# Patient Record
Sex: Male | Born: 1945 | Race: Black or African American | Hispanic: No | Smoking: Current every day smoker
Health system: Southern US, Community
[De-identification: ages and names within clinical notes are randomized; demographics above are authoritative.]

## PROBLEM LIST (undated history)

## (undated) DIAGNOSIS — C801 Malignant (primary) neoplasm, unspecified: Secondary | ICD-10-CM

## (undated) DIAGNOSIS — I10 Essential (primary) hypertension: Secondary | ICD-10-CM

## (undated) DIAGNOSIS — IMO0002 Reserved for concepts with insufficient information to code with codable children: Secondary | ICD-10-CM

## (undated) DIAGNOSIS — F329 Major depressive disorder, single episode, unspecified: Secondary | ICD-10-CM

## (undated) DIAGNOSIS — IMO0001 Reserved for inherently not codable concepts without codable children: Secondary | ICD-10-CM

## (undated) DIAGNOSIS — E119 Type 2 diabetes mellitus without complications: Secondary | ICD-10-CM

## (undated) DIAGNOSIS — T7840XA Allergy, unspecified, initial encounter: Secondary | ICD-10-CM

## (undated) DIAGNOSIS — K219 Gastro-esophageal reflux disease without esophagitis: Secondary | ICD-10-CM

## (undated) HISTORY — DX: Reserved for inherently not codable concepts without codable children: IMO0001

## (undated) HISTORY — PX: OTHER SURGICAL HISTORY: SHX169

## (undated) HISTORY — DX: Essential (primary) hypertension: I10

## (undated) HISTORY — DX: Reserved for concepts with insufficient information to code with codable children: IMO0002

## (undated) HISTORY — DX: Type 2 diabetes mellitus without complications: E11.9

## (undated) HISTORY — DX: Major depressive disorder, single episode, unspecified: F32.9

## (undated) HISTORY — DX: Gastro-esophageal reflux disease without esophagitis: K21.9

---

## 2006-09-11 ENCOUNTER — Ambulatory Visit: Payer: Self-pay | Admitting: Family Medicine

## 2006-09-14 ENCOUNTER — Ambulatory Visit: Payer: Self-pay | Admitting: *Deleted

## 2006-12-10 ENCOUNTER — Ambulatory Visit: Payer: Self-pay | Admitting: Family Medicine

## 2006-12-12 ENCOUNTER — Ambulatory Visit (HOSPITAL_COMMUNITY): Admission: RE | Admit: 2006-12-12 | Discharge: 2006-12-12 | Payer: Self-pay | Admitting: Family Medicine

## 2007-08-07 ENCOUNTER — Encounter (INDEPENDENT_AMBULATORY_CARE_PROVIDER_SITE_OTHER): Payer: Self-pay | Admitting: *Deleted

## 2009-05-28 ENCOUNTER — Emergency Department (HOSPITAL_COMMUNITY): Admission: EM | Admit: 2009-05-28 | Discharge: 2009-05-28 | Payer: Self-pay | Admitting: Family Medicine

## 2009-09-08 ENCOUNTER — Emergency Department (HOSPITAL_COMMUNITY): Admission: EM | Admit: 2009-09-08 | Discharge: 2009-09-08 | Payer: Self-pay | Admitting: Family Medicine

## 2009-10-05 ENCOUNTER — Ambulatory Visit: Payer: Self-pay | Admitting: Internal Medicine

## 2009-10-05 DIAGNOSIS — K219 Gastro-esophageal reflux disease without esophagitis: Secondary | ICD-10-CM

## 2009-10-05 DIAGNOSIS — I10 Essential (primary) hypertension: Secondary | ICD-10-CM | POA: Insufficient documentation

## 2009-10-05 DIAGNOSIS — E119 Type 2 diabetes mellitus without complications: Secondary | ICD-10-CM

## 2009-10-05 DIAGNOSIS — F4323 Adjustment disorder with mixed anxiety and depressed mood: Secondary | ICD-10-CM

## 2009-10-05 DIAGNOSIS — F329 Major depressive disorder, single episode, unspecified: Secondary | ICD-10-CM

## 2009-10-05 DIAGNOSIS — F3289 Other specified depressive episodes: Secondary | ICD-10-CM

## 2009-10-05 HISTORY — DX: Gastro-esophageal reflux disease without esophagitis: K21.9

## 2009-10-05 HISTORY — DX: Essential (primary) hypertension: I10

## 2009-10-05 HISTORY — DX: Other specified depressive episodes: F32.89

## 2009-10-05 HISTORY — DX: Type 2 diabetes mellitus without complications: E11.9

## 2009-10-05 HISTORY — DX: Major depressive disorder, single episode, unspecified: F32.9

## 2009-10-12 LAB — CONVERTED CEMR LAB
ALT: 9 units/L (ref 0–53)
AST: 16 units/L (ref 0–37)
Alkaline Phosphatase: 57 units/L (ref 39–117)
BUN: 7 mg/dL (ref 6–23)
Basophils Absolute: 0.1 10*3/uL (ref 0.0–0.1)
Basophils Relative: 1 % (ref 0.0–3.0)
Bilirubin Urine: NEGATIVE
Cholesterol: 206 mg/dL — ABNORMAL HIGH (ref 0–200)
Creatinine, Ser: 1 mg/dL (ref 0.4–1.5)
Creatinine,U: 110.4 mg/dL
Direct LDL: 159 mg/dL
Eosinophils Relative: 4.9 % (ref 0.0–5.0)
HDL: 41.3 mg/dL (ref >39.00)
Hemoglobin: 14 g/dL (ref 13.0–17.0)
Ketones, ur: NEGATIVE mg/dL
Lymphocytes Relative: 42.1 % (ref 12.0–46.0)
Lymphs Abs: 3.6 10*3/uL (ref 0.7–4.0)
MCHC: 33.4 g/dL (ref 30.0–36.0)
MCV: 94.1 fL (ref 78.0–100.0)
PSA: 2.29 ng/mL (ref 0.10–4.00)
Potassium: 4.5 meq/L (ref 3.5–5.1)
RBC: 4.45 M/uL (ref 4.22–5.81)
RDW: 12.8 % (ref 11.5–14.6)
Sodium: 139 meq/L (ref 135–145)
TSH: 1.15 microintl units/mL (ref 0.35–5.50)
Total Protein: 7.4 g/dL (ref 6.0–8.3)
Triglycerides: 120 mg/dL (ref 0.0–149.0)
Urine Glucose: NEGATIVE mg/dL
VLDL: 24 mg/dL (ref 0.0–40.0)

## 2009-12-30 ENCOUNTER — Encounter: Payer: Self-pay | Admitting: Internal Medicine

## 2010-01-31 ENCOUNTER — Encounter: Payer: Self-pay | Admitting: Internal Medicine

## 2010-12-20 NOTE — Letter (Signed)
Summary: Referral - not able to see patient  [facility]  [address]   [place], [place] [address]   Phone: [phone]  Fax: [phone]    January 31, 2010    Retaj Hilbun W. Jaxie Racanelli, M.D. [address] [place], [place] [address]   Re:   Luis Brewer DOB:  07/24/1946 MRN:   [id number]    Dear Dr. Mareta Chesnut:  Thank you for your kind referral of the above patient.  We have attempted to schedule the recommended procedure Screening Colonoscopy but have not been able to schedule because:   X  The patient was not available by phone and/or has not returned our calls.  ___ The patient declined to schedule the procedure at this time.  We appreciate the referral and hope that we will have the opportunity to treat this patient in the future.    Sincerely,    [facility] [phone]    

## 2010-12-20 NOTE — Letter (Signed)
Summary: CMN for Diabetes Testing Supplies/[facility]  CMN for Diabetes Testing Supplies/[facility]   Imported By: Sherian Rein 01/04/2010 08:21:27  _____________________________________________________________________  External Attachment:    Type:   Image     Comment:   External Document

## 2010-12-20 NOTE — Letter (Signed)
Summary: Referral - not able to see patient  [facility]  [address] [place], [place] [address]   Phone: [phone]  Fax: [phone]    January 31, 2010    Corwin Levins, M.D. [address] [place], [place] [address]   Luis Brewer DOB:  07-09-1946 MRN:   [id number]    Dear Dr. Jonny Ruiz:  Thank you for your kind referral of the above patient.  We have attempted to schedule the recommended procedure Screening Colonoscopy but have not been able to schedule because:   X  The patient was not available by phone and/or has not returned our calls.  ___ The patient declined to schedule the procedure at this time.  We appreciate the referral and hope that we will have the opportunity to treat this patient in the future.    Sincerely,    [facility] [phone]

## 2011-06-13 ENCOUNTER — Encounter: Payer: Self-pay | Admitting: Internal Medicine

## 2011-06-13 DIAGNOSIS — Z0000 Encounter for general adult medical examination without abnormal findings: Secondary | ICD-10-CM | POA: Insufficient documentation

## 2011-06-15 ENCOUNTER — Other Ambulatory Visit (INDEPENDENT_AMBULATORY_CARE_PROVIDER_SITE_OTHER): Payer: Medicare Other

## 2011-06-15 ENCOUNTER — Encounter: Payer: Self-pay | Admitting: Internal Medicine

## 2011-06-15 ENCOUNTER — Ambulatory Visit (INDEPENDENT_AMBULATORY_CARE_PROVIDER_SITE_OTHER): Payer: Medicare Other | Admitting: Internal Medicine

## 2011-06-15 VITALS — BP 140/90 | HR 87 | Temp 98.1°F | Ht 71.0 in | Wt 157.2 lb

## 2011-06-15 DIAGNOSIS — R5383 Other fatigue: Secondary | ICD-10-CM | POA: Insufficient documentation

## 2011-06-15 DIAGNOSIS — F329 Major depressive disorder, single episode, unspecified: Secondary | ICD-10-CM

## 2011-06-15 DIAGNOSIS — E119 Type 2 diabetes mellitus without complications: Secondary | ICD-10-CM

## 2011-06-15 DIAGNOSIS — R5381 Other malaise: Secondary | ICD-10-CM

## 2011-06-15 DIAGNOSIS — N320 Bladder-neck obstruction: Secondary | ICD-10-CM

## 2011-06-15 DIAGNOSIS — F3289 Other specified depressive episodes: Secondary | ICD-10-CM

## 2011-06-15 DIAGNOSIS — F1010 Alcohol abuse, uncomplicated: Secondary | ICD-10-CM | POA: Insufficient documentation

## 2011-06-15 DIAGNOSIS — I10 Essential (primary) hypertension: Secondary | ICD-10-CM

## 2011-06-15 LAB — CBC WITH DIFFERENTIAL/PLATELET
Basophils Relative: 0.8 % (ref 0.0–3.0)
Eosinophils Relative: 2.3 % (ref 0.0–5.0)
HCT: 38.3 % — ABNORMAL LOW (ref 39.0–52.0)
Hemoglobin: 13.3 g/dL (ref 13.0–17.0)
Lymphocytes Relative: 34.9 % (ref 12.0–46.0)
MCHC: 34.8 g/dL (ref 30.0–36.0)
Monocytes Relative: 8.8 % (ref 3.0–12.0)
Neutrophils Relative %: 53.2 % (ref 43.0–77.0)
RBC: 3.9 Mil/uL — ABNORMAL LOW (ref 4.22–5.81)
RDW: 13.4 % (ref 11.5–14.6)

## 2011-06-15 LAB — URINALYSIS, ROUTINE W REFLEX MICROSCOPIC
Hgb urine dipstick: NEGATIVE
Leukocytes, UA: NEGATIVE
Nitrite: NEGATIVE
Total Protein, Urine: NEGATIVE
Urine Glucose: NEGATIVE
pH: 7 (ref 5.0–8.0)

## 2011-06-15 MED ORDER — DISULFIRAM 250 MG PO TABS: 250.0000 mg | ORAL_TABLET | Freq: Every day | ORAL | Status: AC

## 2011-06-15 MED ORDER — METFORMIN HCL 500 MG PO TABS: 500.0000 mg | ORAL_TABLET | Freq: Two times a day (BID) | ORAL | Status: DC

## 2011-06-15 MED ORDER — CITALOPRAM HYDROBROMIDE 40 MG PO TABS: 40.0000 mg | ORAL_TABLET | Freq: Every day | ORAL | Status: DC

## 2011-06-15 MED ORDER — CLONIDINE HCL 0.1 MG PO TABS: 0.1000 mg | ORAL_TABLET | Freq: Two times a day (BID) | ORAL | Status: DC

## 2011-06-15 MED ORDER — SIMVASTATIN 40 MG PO TABS: 40.0000 mg | ORAL_TABLET | Freq: Every day | ORAL | Status: DC

## 2011-06-15 MED ORDER — OMEPRAZOLE 40 MG PO CPDR: 40.0000 mg | DELAYED_RELEASE_CAPSULE | Freq: Every day | ORAL | Status: DC
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
## 2011-06-15 NOTE — Assessment & Plan Note (Signed)
stable overall by hx and exam, most recent data reviewed with pt, and pt to continue medical treatment as before  BP Readings from Last 3 Encounters:  06/15/11 140/90  10/05/09 112/70

## 2011-06-15 NOTE — Assessment & Plan Note (Signed)
stable overall by hx and exam, most recent data reviewed with pt, and pt to continue medical treatment as before  Lab Results  Component Value Date   HGBA1C 7.1* 10/05/2009

## 2011-06-15 NOTE — Assessment & Plan Note (Signed)
stable overall by hx and exam, most recent data reviewed with pt, and pt to continue medical treatment as before  Lab Results  Component Value Date   WBC 8.5 10/05/2009   HGB 14.0 10/05/2009   HCT 41.9 10/05/2009   PLT 233.0 10/05/2009   CHOL 206* 10/05/2009   TRIG 120.0 10/05/2009   HDL 41.30 10/05/2009   LDLDIRECT 159.0 10/05/2009   ALT 9 10/05/2009   AST 16 10/05/2009   NA 139 10/05/2009   K 4.5 10/05/2009   CL 101 10/05/2009   CREATININE 1.0 10/05/2009   BUN 7 10/05/2009   CO2 30 10/05/2009   TSH 1.15 10/05/2009   PSA 2.29 10/05/2009   HGBA1C 7.1* 10/05/2009   MICROALBUR 0.2 10/05/2009

## 2011-06-15 NOTE — Assessment & Plan Note (Signed)
Also due for PSA 

## 2011-06-15 NOTE — Assessment & Plan Note (Signed)
Etiology unclear, Exam otherwise benign, to check labs as documented, follow with expectant management  

## 2011-06-15 NOTE — Assessment & Plan Note (Signed)
Ok for daily antabuse per pt request, also encouraged to seek AA

## 2011-06-15 NOTE — Patient Instructions (Signed)
Take all new medications as prescribed  - the antabuse Continue all other medications as before Please go to LAB in the Basement for the blood and/or urine tests to be done today Please call the phone number [phone] (the PhoneTree System) for results of testing in 2-3 days;  When calling, simply dial the number, and when prompted enter the MRN number above (the Medical Record Number) and the # key, then the message should start. Please return in 6 months, or sooner if needed

## 2011-06-16 ENCOUNTER — Other Ambulatory Visit: Payer: Self-pay | Admitting: Internal Medicine

## 2011-06-16 ENCOUNTER — Ambulatory Visit: Payer: Medicare Other

## 2011-06-16 DIAGNOSIS — R972 Elevated prostate specific antigen [PSA]: Secondary | ICD-10-CM | POA: Insufficient documentation

## 2011-06-16 DIAGNOSIS — R7402 Elevation of levels of lactic acid dehydrogenase (LDH): Secondary | ICD-10-CM

## 2011-06-16 LAB — BASIC METABOLIC PANEL
BUN: 5 mg/dL — ABNORMAL LOW (ref 6–23)
Calcium: 9.7 mg/dL (ref 8.4–10.5)
Chloride: 91 mEq/L — ABNORMAL LOW (ref 96–112)

## 2011-06-16 LAB — HEPATIC FUNCTION PANEL
ALT: 82 U/L — ABNORMAL HIGH (ref 0–53)
Albumin: 4.1 g/dL (ref 3.5–5.2)
Bilirubin, Direct: 0.2 mg/dL (ref 0.0–0.3)
Total Bilirubin: 0.7 mg/dL (ref 0.3–1.2)
Total Protein: 8.3 g/dL (ref 6.0–8.3)

## 2011-06-16 LAB — TSH: TSH: 1.34 u[IU]/mL (ref 0.35–5.50)

## 2011-06-16 LAB — LIPID PANEL
HDL: 66.4 mg/dL (ref >39.00)
Triglycerides: 48 mg/dL (ref 0.0–149.0)
VLDL: 9.6 mg/dL (ref 0.0–40.0)

## 2011-06-18 LAB — HEPATITIS PANEL, ACUTE
HCV Ab: NEGATIVE
Hep A IgM: NEGATIVE
Hep B C IgM: NEGATIVE

## 2011-06-19 NOTE — Progress Notes (Signed)
Quick Note:  Voice message left on PhoneTree system - lab is negative, normal or otherwise stable, pt to continue same tx ______ 

## 2011-06-28 ENCOUNTER — Encounter: Payer: Self-pay | Admitting: Internal Medicine

## 2012-08-30 DIAGNOSIS — C801 Malignant (primary) neoplasm, unspecified: Secondary | ICD-10-CM

## 2012-08-30 HISTORY — PX: OTHER SURGICAL HISTORY: SHX169

## 2012-08-30 HISTORY — DX: Malignant (primary) neoplasm, unspecified: C80.1

## 2012-09-09 ENCOUNTER — Encounter: Payer: Self-pay | Admitting: *Deleted

## 2012-09-09 ENCOUNTER — Telehealth: Payer: Self-pay | Admitting: Oncology

## 2012-09-09 NOTE — Telephone Encounter (Signed)
C/D 09/09/12 for appt 09/16/12

## 2012-09-10 ENCOUNTER — Ambulatory Visit
Admission: RE | Admit: 2012-09-10 | Discharge: 2012-09-10 | Disposition: A | Discharge status: Home / Self Care | Payer: Medicare Other | Source: Ambulatory Visit | Attending: Radiation Oncology | Admitting: Radiation Oncology

## 2012-09-10 ENCOUNTER — Encounter: Payer: Self-pay | Admitting: Radiation Oncology

## 2012-09-10 VITALS — BP 135/87 | HR 94 | Temp 97.8°F | Resp 20 | Wt 144.6 lb

## 2012-09-10 DIAGNOSIS — Z79899 Other long term (current) drug therapy: Secondary | ICD-10-CM | POA: Insufficient documentation

## 2012-09-10 DIAGNOSIS — C499 Malignant neoplasm of connective and soft tissue, unspecified: Secondary | ICD-10-CM

## 2012-09-10 DIAGNOSIS — I10 Essential (primary) hypertension: Secondary | ICD-10-CM | POA: Insufficient documentation

## 2012-09-10 DIAGNOSIS — C4920 Malignant neoplasm of connective and soft tissue of unspecified lower limb, including hip: Secondary | ICD-10-CM

## 2012-09-10 DIAGNOSIS — K219 Gastro-esophageal reflux disease without esophagitis: Secondary | ICD-10-CM | POA: Insufficient documentation

## 2012-09-10 DIAGNOSIS — R634 Abnormal weight loss: Secondary | ICD-10-CM | POA: Insufficient documentation

## 2012-09-10 DIAGNOSIS — C801 Malignant (primary) neoplasm, unspecified: Secondary | ICD-10-CM

## 2012-09-10 DIAGNOSIS — Z510 Encounter for antineoplastic radiation therapy: Secondary | ICD-10-CM | POA: Insufficient documentation

## 2012-09-10 DIAGNOSIS — E119 Type 2 diabetes mellitus without complications: Secondary | ICD-10-CM | POA: Insufficient documentation

## 2012-09-10 HISTORY — DX: Malignant (primary) neoplasm, unspecified: C80.1

## 2012-09-10 MED ORDER — HYDROCODONE-ACETAMINOPHEN 5-500 MG PO CAPS: 1.0000 | ORAL_CAPSULE | Freq: Four times a day (QID) | ORAL | Status: DC | PRN

## 2012-09-10 NOTE — Addendum Note (Signed)
Encounter addended by: [facility] Mintz Jeanae Whitmill, RN on: 09/10/2012 11:34 AM<BR>     Documentation filed: Charges VN
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
## 2012-09-10 NOTE — Progress Notes (Signed)
Path: 10/10 13: Left Thigh: high grade sarcoma with areas of necrosis and features most consistent with pleomorphic liposarcoma Patient here alert,oriented x3,divorced, 4 children,just moved here 3 days ago living with daughter, knot /mass left thigh/groin area, pain  States"9/10 scale, took ultram 50mg  this am little relief    Allergies: NKda

## 2012-09-16 ENCOUNTER — Ambulatory Visit: Payer: Medicare Other | Admitting: Oncology

## 2012-09-16 ENCOUNTER — Ambulatory Visit: Payer: Medicare Other

## 2012-09-16 ENCOUNTER — Other Ambulatory Visit: Payer: Medicare Other

## 2012-11-19 NOTE — Progress Notes (Signed)
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

## 2012-11-21 ENCOUNTER — Ambulatory Visit
Admission: RE | Admit: 2012-11-21 | Discharge: 2012-11-21 | Disposition: A | Discharge status: Home / Self Care | Payer: Medicare Other | Source: Ambulatory Visit | Attending: Radiation Oncology | Admitting: Radiation Oncology

## 2012-11-21 ENCOUNTER — Encounter: Payer: Self-pay | Admitting: Radiation Oncology

## 2012-11-21 VITALS — BP 148/79 | HR 95 | Temp 97.4°F | Resp 20 | Ht 69.0 in | Wt 163.9 lb

## 2012-11-21 DIAGNOSIS — C4920 Malignant neoplasm of connective and soft tissue of unspecified lower limb, including hip: Secondary | ICD-10-CM

## 2012-11-21 HISTORY — DX: Allergy, unspecified, initial encounter: T78.40XA

## 2012-11-21 NOTE — Progress Notes (Signed)
CC: Dr. Josefine Class fax number [phone], Dr. Cline Cools fax number [phone] , Dr. 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 I first saw the patient in consultation on 09/10/2012 at which time he presented with a left thigh mass with referral from Dr. Jennelle Human in [facility]. A noncontrast MRI scan from 08/07/2012 showed a 9.2 x 9.1 x 6.7 cm mass arising from the abductor magnets muscle. I referred him to Dr. Josefine Class at [facility] where he was fully staged. I do not have his outside information with respect to his pathology, and operative report. His preoperative CT scan to the chest, abdomen, and pelvis showed a 13 x 10 cm left thigh mass displaced the femoral arteries and veins with no evidence for metastatic disease. There was a nonspecific 0.6 cm left lower lobe ground glass pulmonary nodule for which follow up was recommended. Understand he was taken to the OR on November 11 hours time he had a radical resection of a 12.5 x 9.0 cm high-grade pleomorphic sarcoma arising from the left abductor magnets muscle. Pathology revealed negative margins, but disease was seen 0.1 cm from the lateral margin. He had local advancement flaps for closure. He had significant intraoperative bleeding and a lengthy operation. He was seen postoperatively by medical oncology and radiation oncology. He is referred today by Dr. Serita Sheller for postoperative radiation therapy closer to home where he now lives with his sister. He is without complaints today. He does walk with a cane.  Physical examination: Alert and oriented 67 year old African- American male appearing his stated  age. Wt Readings from Last 3 Encounters:  11/21/12 163 lb 14.4 oz (74.345 kg)  09/10/12 144 lb 9.6 oz (65.59 kg)  06/15/11 157 lb 4 oz (71.328 kg)   Temp Readings from Last 3 Encounters:  11/21/12 97.4 F (36.3 C) Oral  09/10/12 97.8 F (36.6 C)   06/15/11 98.1 F (36.7 C) Oral   BP Readings from Last 3 Encounters:  11/21/12 148/79  09/10/12 135/87  06/15/11 140/90   Pulse Readings from Last 3 Encounters:  11/21/12 95  09/10/12 94  06/15/11 87   Head neck examination: Grossly unremarkable. Nodes: Without palpable cervical, supraclavicular, or inguinal/femoral/popliteal adenopathy. Chest: Lungs clear. Heart: Regular in rhythm. Abdomen: Without masses organomegaly. Genitalia: Unremarkable to inspection. Extremities: There is a vertical scar extending from his left inguinal fold inferiorly to the left mid thigh anteriorly and anteromedially. There is obvious surgical defect but no visible or palpable evidence for recurrent disease. There is no left lower extremity edema. Neurologic examination: There is weakness of left hip abduction and left hip flexion.  Impression: Stage III (T2 B. N0 M0) high-grade pleomorphic sarcoma arising from the left abductor magnus muscle. I need to obtain his preoperative MRI scan, pathology, and operative report from [facility]. He would benefit from postoperative radiation therapy to improve his local regional control. The patient's daughter tells me that chemotherapy would be optional following radiation therapy. There are going to be transportation issues, and I'm having the patient and his daughter speak with social services to see if transportation can be arranged from his residence here in [place]. We discussed the potential acute and late toxicities of radiation  therapy. He'll return for treatment planning hopefully within the next one to 2 weeks. Consent is signed today.  30 minutes was spent face-to-face with the patient and his daughter,  primarily counseling the patient.

## 2012-11-21 NOTE — Progress Notes (Signed)
Patient here follow up  New consult left thigh high grade sarcoma PT2bcNO, seen by Dr. Nichola Sizer 11/05/12 09/30/12 radical resection of left  large deep soft tissue tumor,dissection of the superficial femoral artery and vein,local advancement of flaps Patient alert,orioented x3, patient ambulating steady gait with cane, no c/o pain, just c/o soreness, daughter with patient 9:01 AM

## 2012-11-25 ENCOUNTER — Ambulatory Visit
Admission: RE | Admit: 2012-11-25 | Discharge: 2012-11-25 | Disposition: A | Discharge status: Home / Self Care | Payer: Medicare Other | Source: Ambulatory Visit | Attending: Radiation Oncology | Admitting: Radiation Oncology

## 2012-11-25 ENCOUNTER — Telehealth: Payer: Self-pay | Admitting: Radiation Oncology

## 2012-11-25 DIAGNOSIS — C4920 Malignant neoplasm of connective and soft tissue of unspecified lower limb, including hip: Secondary | ICD-10-CM

## 2012-11-25 NOTE — Telephone Encounter (Signed)
Met w patient to discuss RO billing. Pt had some financial concerns and was given a EPP, ACS and MCD application to complete. Pt also advised he has applied for SCAT for his transportation assistance at this time.  Dx:  Soft tissue sarcoma of lower extremity  - Primary 171.3  Attending Rad: RM  Rad Tx: IMRT

## 2012-11-25 NOTE — Progress Notes (Signed)
Simulation/treatment planning note: The patient was taken to the CT simulator. His left leg was abducted with his knee bent to open the inguinal fold and allow [place] to taper genitalia to the right. A Vac lock device was used for immobilization. His anteromedial surgical scar was marked with a radiopaque wire. His drain site, inferiorly was also marked with a BB. He was then scanned. We performed a fusion of his preoperative MRI scan with his treatment planning CT scan. I contoured his tumor bed and expanded this by 1.5 cm to create his treatment CTV for the first phase of his IMRT. The CTV was expanded by 0.8 cm to create PTV 50.4 which will receive 50.4 Gy in 28 sessions. He'll be treated with 6 MV photons, IMRT Tomotherapy. I requesting daily MV CT setting up to the femur/bone. He'll then undergo a reduced field to deliver a further 12.6 Gy in 7 sessions for cumulative dose of 63 gray and 35 sessions. He is now ready for IMRT simulation/treatment planning.

## 2012-12-02 ENCOUNTER — Ambulatory Visit: Payer: Medicare Other | Admitting: Radiation Oncology

## 2012-12-02 ENCOUNTER — Encounter: Payer: Self-pay | Admitting: Radiation Oncology

## 2012-12-02 NOTE — Progress Notes (Signed)
IMRT simulation/treatment planning note: Luis Brewer completed his IMRT simulation/treatment planning in the postoperative management of his soft tissue sarcoma involving the left thigh. IMRT was chosen to decrease the risk for genital edema, lower extremity edema and bone fracture compared to conventional or 3-D conformal radiation therapy. As mentioned previously, I expanded his tumor bed by 1.5 cm to create CTV tumor bed and expanded this by 0.5 cm to create PTV 50.4 . Dose volume histograms were obtained for the target structures and also avoidance structures. We were only able to deliver approximately 90% of the prescribed dose to his planning target volume and CTV in your to lead a lymphatic strip laterally and also to avoid excessive treatment to the genitalia. The lateral region with the closest margin was recovered satisfactorily. The patient is undergoing helical IMRT Tomotherapy with 6 MV photons. He'll undergo daily MV CT setting up to his femur and surgical clips. Please see the electronic medical record for specific dose volume histograms.

## 2012-12-03 ENCOUNTER — Ambulatory Visit: Payer: Medicare Other

## 2012-12-04 ENCOUNTER — Ambulatory Visit: Payer: Medicare Other

## 2012-12-04 ENCOUNTER — Ambulatory Visit: Payer: Medicare Other | Admitting: Radiation Oncology

## 2012-12-05 ENCOUNTER — Ambulatory Visit: Payer: Medicare Other

## 2012-12-06 ENCOUNTER — Ambulatory Visit: Payer: Medicare Other

## 2012-12-09 ENCOUNTER — Ambulatory Visit: Payer: Medicare Other

## 2012-12-10 ENCOUNTER — Ambulatory Visit: Payer: Medicare Other

## 2012-12-11 ENCOUNTER — Ambulatory Visit: Payer: Medicare Other

## 2012-12-11 ENCOUNTER — Encounter: Payer: Self-pay | Admitting: Radiation Oncology

## 2012-12-11 NOTE — Progress Notes (Signed)
The patient was presented at Tomotherapy/[facility], and the physicians (MM, JK, SS, JM, and SW) were agreement with the proposed prescription and planned. The patient will start radiation therapy tomorrow.

## 2012-12-12 ENCOUNTER — Encounter: Payer: Self-pay | Admitting: Radiation Oncology

## 2012-12-12 ENCOUNTER — Ambulatory Visit: Payer: Medicare Other

## 2012-12-12 ENCOUNTER — Ambulatory Visit
Admission: RE | Admit: 2012-12-12 | Discharge: 2012-12-12 | Disposition: A | Discharge status: Home / Self Care | Payer: Medicare Other | Source: Ambulatory Visit | Attending: Radiation Oncology | Admitting: Radiation Oncology

## 2012-12-12 DIAGNOSIS — L988 Other specified disorders of the skin and subcutaneous tissue: Secondary | ICD-10-CM | POA: Insufficient documentation

## 2012-12-12 DIAGNOSIS — Z510 Encounter for antineoplastic radiation therapy: Secondary | ICD-10-CM | POA: Insufficient documentation

## 2012-12-12 DIAGNOSIS — Y842 Radiological procedure and radiotherapy as the cause of abnormal reaction of the patient, or of later complication, without mention of misadventure at the time of the procedure: Secondary | ICD-10-CM | POA: Insufficient documentation

## 2012-12-12 DIAGNOSIS — C4920 Malignant neoplasm of connective and soft tissue of unspecified lower limb, including hip: Secondary | ICD-10-CM | POA: Insufficient documentation

## 2012-12-12 NOTE — Progress Notes (Signed)
Please see Tomotherapy segmentation note from earlier today.

## 2012-12-12 NOTE — Progress Notes (Signed)
Chart note: The patient underwent Tomotherapy segmentation for treatment of his left lower extremity sarcoma. He is being treated to 16.5 delivered field widths corresponding to one set of IMRT treatment devices [phone]).

## 2012-12-13 ENCOUNTER — Ambulatory Visit: Payer: Medicare Other

## 2012-12-13 ENCOUNTER — Ambulatory Visit
Admission: RE | Admit: 2012-12-13 | Discharge: 2012-12-13 | Disposition: A | Discharge status: Home / Self Care | Payer: Medicare Other | Source: Ambulatory Visit | Attending: Radiation Oncology | Admitting: Radiation Oncology

## 2012-12-16 ENCOUNTER — Ambulatory Visit: Payer: Medicare Other

## 2012-12-16 ENCOUNTER — Ambulatory Visit
Admission: RE | Admit: 2012-12-16 | Discharge: 2012-12-16 | Disposition: A | Discharge status: Home / Self Care | Payer: Medicare Other | Source: Ambulatory Visit | Attending: Radiation Oncology | Admitting: Radiation Oncology

## 2012-12-16 ENCOUNTER — Encounter: Payer: Self-pay | Admitting: Radiation Oncology

## 2012-12-16 VITALS — BP 111/78 | HR 80 | Temp 97.8°F | Resp 20 | Wt 166.5 lb

## 2012-12-16 DIAGNOSIS — C4920 Malignant neoplasm of connective and soft tissue of unspecified lower limb, including hip: Secondary | ICD-10-CM

## 2012-12-16 MED ORDER — RADIAPLEXRX EX GEL
Freq: Once | CUTANEOUS | Status: AC
  Administered 2012-12-16: 16:00:00 via TOPICAL

## 2012-12-16 NOTE — Progress Notes (Signed)
Post sim ed completed and charted under post sim ed appt. Pt c/o mild pain in left inner thigh due to surgery. He takes Advil prn, mostly at night to help him sleep. Pt states he is out of other pain meds. Pt denies fatigue, loss of appetite. Pt has not been tx today.

## 2012-12-16 NOTE — Addendum Note (Signed)
Encounter addended by: Glennie Hawk, RN on: 12/16/2012  3:56 PM<BR>     Documentation filed: Inpatient MAR, Orders

## 2012-12-16 NOTE — Progress Notes (Signed)
Weekly Management Note:  Site: Proximal left lower extremity Current Dose:  540  cGy Projected Dose: 5040  cGy  Narrative: The patient is seen today for routine under treatment assessment. CBCT/MVCT images/port films were reviewed. The chart was reviewed.   He is without complaints today. He is setup is excellent with daily MV CT.  Physical Examination:  Filed Vitals:   12/16/12 1424  BP: 111/78  Pulse: 80  Temp: 97.8 F (36.6 C)  Resp: 20  .  Weight: 166 lb 8 oz (75.524 kg). No significant skin changes. No change.  Impression: Tolerating radiation therapy well.  Plan: Continue radiation therapy as planned.

## 2012-12-16 NOTE — Progress Notes (Signed)
Post sim ed completed w/pt. Gave pt "Radiation and You" booklet w/all pertinent pages marked and discussed,re :fatigue, skin irritation/care, pain, nutrition. Gave pt Radiaplex lotion w/instructions for proper use. Pt verbalized understanding.

## 2012-12-17 ENCOUNTER — Ambulatory Visit: Payer: Medicare Other

## 2012-12-17 ENCOUNTER — Telehealth: Payer: Self-pay | Admitting: Radiation Oncology

## 2012-12-17 ENCOUNTER — Ambulatory Visit
Admission: RE | Admit: 2012-12-17 | Discharge: 2012-12-17 | Disposition: A | Discharge status: Home / Self Care | Payer: Medicare Other | Source: Ambulatory Visit | Attending: Radiation Oncology | Admitting: Radiation Oncology

## 2012-12-17 ENCOUNTER — Encounter: Payer: Self-pay | Admitting: Radiation Oncology

## 2012-12-17 NOTE — Telephone Encounter (Signed)
INDIGENT APPROVED 80% FAMILY SIZE: 1 HH INC: 16,272 MOD POV: 16,086 - 16,109.60 VALID DATES: 12/12/2012 - 06/11/2013  80% INDIGENT - PLEASE  APPLY DISCOUNT TO ANY  6 MONTHS PRIOR AND ALL CURRENT BILL. [facility] $400

## 2012-12-18 ENCOUNTER — Ambulatory Visit
Admission: RE | Admit: 2012-12-18 | Discharge: 2012-12-18 | Disposition: A | Discharge status: Home / Self Care | Payer: Medicare Other | Source: Ambulatory Visit | Attending: Radiation Oncology | Admitting: Radiation Oncology

## 2012-12-18 ENCOUNTER — Ambulatory Visit: Payer: Medicare Other

## 2012-12-19 ENCOUNTER — Ambulatory Visit: Payer: Medicare Other

## 2012-12-19 ENCOUNTER — Ambulatory Visit
Admission: RE | Admit: 2012-12-19 | Discharge: 2012-12-19 | Disposition: A | Discharge status: Home / Self Care | Payer: Medicare Other | Source: Ambulatory Visit | Attending: Radiation Oncology | Admitting: Radiation Oncology

## 2012-12-20 ENCOUNTER — Ambulatory Visit
Admission: RE | Admit: 2012-12-20 | Discharge: 2012-12-20 | Disposition: A | Discharge status: Home / Self Care | Payer: Medicare Other | Source: Ambulatory Visit | Attending: Radiation Oncology | Admitting: Radiation Oncology

## 2012-12-20 ENCOUNTER — Ambulatory Visit: Payer: Medicare Other

## 2012-12-23 ENCOUNTER — Ambulatory Visit
Admission: RE | Admit: 2012-12-23 | Discharge: 2012-12-23 | Disposition: A | Discharge status: Home / Self Care | Payer: Medicare Other | Source: Ambulatory Visit | Attending: Radiation Oncology | Admitting: Radiation Oncology

## 2012-12-23 ENCOUNTER — Encounter: Payer: Self-pay | Admitting: Radiation Oncology

## 2012-12-23 ENCOUNTER — Ambulatory Visit: Payer: Medicare Other

## 2012-12-23 VITALS — BP 149/78 | HR 80 | Temp 97.8°F | Resp 20 | Wt 174.7 lb

## 2012-12-23 DIAGNOSIS — C4920 Malignant neoplasm of connective and soft tissue of unspecified lower limb, including hip: Secondary | ICD-10-CM

## 2012-12-23 NOTE — Progress Notes (Signed)
Pt denies pain, fatigue, loss of appetite. He is applying Radiaplex to left thigh tx area.

## 2012-12-23 NOTE — Progress Notes (Signed)
Weekly Management Note:  Site: Proximal left lower extremity Current Dose:  1440  cGy Projected Dose: 5040  cGy followed by boost  Narrative: The patient is seen today for routine under treatment assessment. CBCT/MVCT images/port films were reviewed. The chart was reviewed.   No complaints today.  Physical Examination:  Filed Vitals:   12/23/12 1402  BP: 149/78  Pulse: 80  Temp: 97.8 F (36.6 C)  Resp: 20  .  Weight: 174 lb 11.2 oz (79.243 kg). There is hyperpigmentation the skin along his proximal left lower extremity with no areas of desquamation.  Impression: Tolerating radiation therapy well.  Plan: Continue radiation therapy as planned.

## 2012-12-24 ENCOUNTER — Ambulatory Visit: Payer: Medicare Other

## 2012-12-24 ENCOUNTER — Ambulatory Visit
Admission: RE | Admit: 2012-12-24 | Discharge: 2012-12-24 | Disposition: A | Discharge status: Home / Self Care | Payer: Medicare Other | Source: Ambulatory Visit | Attending: Radiation Oncology | Admitting: Radiation Oncology

## 2012-12-25 ENCOUNTER — Ambulatory Visit: Payer: Medicare Other

## 2012-12-25 ENCOUNTER — Ambulatory Visit
Admission: RE | Admit: 2012-12-25 | Discharge: 2012-12-25 | Disposition: A | Discharge status: Home / Self Care | Payer: Medicare Other | Source: Ambulatory Visit | Attending: Radiation Oncology | Admitting: Radiation Oncology

## 2012-12-26 ENCOUNTER — Ambulatory Visit: Payer: Medicare Other

## 2012-12-26 ENCOUNTER — Ambulatory Visit
Admission: RE | Admit: 2012-12-26 | Discharge: 2012-12-26 | Disposition: A | Discharge status: Home / Self Care | Payer: Medicare Other | Source: Ambulatory Visit | Attending: Radiation Oncology | Admitting: Radiation Oncology

## 2012-12-27 ENCOUNTER — Telehealth: Payer: Self-pay | Admitting: Radiation Oncology

## 2012-12-27 ENCOUNTER — Ambulatory Visit: Payer: Medicare Other

## 2012-12-27 ENCOUNTER — Ambulatory Visit
Admission: RE | Admit: 2012-12-27 | Discharge: 2012-12-27 | Disposition: A | Discharge status: Home / Self Care | Payer: Medicare Other | Source: Ambulatory Visit | Attending: Radiation Oncology | Admitting: Radiation Oncology

## 2012-12-27 NOTE — Telephone Encounter (Signed)
Pt has transportation concerns. I am faxing an ACS application today. [place] so I think bus passes are not an option.  I have updated this information with Leotis Shames and Cammy Copa, also.

## 2012-12-30 ENCOUNTER — Ambulatory Visit: Payer: Medicare Other

## 2012-12-30 ENCOUNTER — Ambulatory Visit
Admission: RE | Admit: 2012-12-30 | Discharge: 2012-12-30 | Disposition: A | Discharge status: Home / Self Care | Payer: Medicare Other | Source: Ambulatory Visit | Attending: Radiation Oncology | Admitting: Radiation Oncology

## 2012-12-30 VITALS — BP 128/83 | HR 84 | Temp 98.3°F | Wt 176.1 lb

## 2012-12-30 DIAGNOSIS — C4920 Malignant neoplasm of connective and soft tissue of unspecified lower limb, including hip: Secondary | ICD-10-CM

## 2012-12-30 NOTE — Progress Notes (Signed)
Patient for weekly assessment of left thigh radiation.Pain level  "6".states he has been out of both hydrocodone and tramadol a few weeks.tates he can now afford as has [facility] prescription drug plan.Has mild fatigue.

## 2012-12-30 NOTE — Progress Notes (Signed)
Weekly Management Note:  Site: Possible left lower extremity Current Dose:  2340  cGy Projected Dose: 5040  cGy followed by reduced field boost  Narrative: The patient is seen today for routine under treatment assessment. CBCT/MVCT images/port films were reviewed. The chart was reviewed.   He does report continued discomfort along the medial aspect of his left thigh and  left groin. He no longer has any hydrocodone and has been using Aleve. Aleve has been helpful.  Physical Examination:  Filed Vitals:   12/30/12 1629  BP: 128/83  Pulse: 84  Temp: 98.3 F (36.8 C)  .  Weight: 176 lb 1.6 oz (79.878 kg). There is hyperpigmentation the skin and dry desquamation along his left medial inguinal region adjacent to his scrotum.  Impression: Tolerating radiation therapy well. I expect he may develop a moist desquamation along his left medial inguinal region. We'll make sure that his genitalia is taped far to the right. We'll continue with Aleve for pain control.  Plan: Continue radiation therapy as planned.

## 2012-12-31 ENCOUNTER — Ambulatory Visit
Admission: RE | Admit: 2012-12-31 | Discharge: 2012-12-31 | Disposition: A | Discharge status: Home / Self Care | Payer: Medicare Other | Source: Ambulatory Visit | Attending: Radiation Oncology | Admitting: Radiation Oncology

## 2012-12-31 ENCOUNTER — Ambulatory Visit: Payer: Medicare Other

## 2013-01-01 ENCOUNTER — Ambulatory Visit
Admission: RE | Admit: 2013-01-01 | Discharge: 2013-01-01 | Disposition: A | Discharge status: Home / Self Care | Payer: Medicare Other | Source: Ambulatory Visit | Attending: Radiation Oncology | Admitting: Radiation Oncology

## 2013-01-01 ENCOUNTER — Ambulatory Visit: Payer: Medicare Other

## 2013-01-02 ENCOUNTER — Ambulatory Visit: Payer: Medicare Other

## 2013-01-03 ENCOUNTER — Ambulatory Visit: Payer: Medicare Other

## 2013-01-03 ENCOUNTER — Ambulatory Visit
Admission: RE | Admit: 2013-01-03 | Discharge: 2013-01-03 | Disposition: A | Discharge status: Home / Self Care | Payer: Medicare Other | Source: Ambulatory Visit | Attending: Radiation Oncology | Admitting: Radiation Oncology

## 2013-01-06 ENCOUNTER — Ambulatory Visit
Admission: RE | Admit: 2013-01-06 | Discharge: 2013-01-06 | Disposition: A | Discharge status: Home / Self Care | Payer: Medicare Other | Source: Ambulatory Visit | Attending: Radiation Oncology | Admitting: Radiation Oncology

## 2013-01-06 ENCOUNTER — Ambulatory Visit: Payer: Medicare Other

## 2013-01-06 VITALS — BP 135/69 | HR 85 | Temp 98.5°F | Wt 178.3 lb

## 2013-01-06 DIAGNOSIS — C4920 Malignant neoplasm of connective and soft tissue of unspecified lower limb, including hip: Secondary | ICD-10-CM

## 2013-01-06 NOTE — Progress Notes (Signed)
Weekly Management Note:  Site: Proximal left lower extremity Current Dose:  3060  cGy Projected Dose: 5040  cGy  Narrative: The patient is seen today for routine under treatment assessment. CBCT/MVCT images/port films were reviewed. There is a small portion of his genitalia at the edge of his treatment field. The chart was reviewed.   He still doing well but does have left groin discomfort.  He continues with Aleve and also cornstarch.  Physical Examination:  Filed Vitals:   01/06/13 1654  BP: 135/69  Pulse: 85  Temp: 98.5 F (36.9 C)  .  Weight: 178 lb 4.8 oz (80.876 kg). There is hyperpigmentation the skin with focal moist desquamation along his left groin.  Impression: Tolerating radiation therapy well. We will make an effort to tape the genitalia as best we can to the right. His discomfort is reasonably well controlled with Aleve.  Plan: Continue radiation therapy as planned.

## 2013-01-06 NOTE — Progress Notes (Signed)
Patient here for weekly assessment of radiation to left thigh.Pain level "4" takes aleve with good relief.No questions or concerns voiced.

## 2013-01-07 ENCOUNTER — Ambulatory Visit
Admission: RE | Admit: 2013-01-07 | Discharge: 2013-01-07 | Disposition: A | Discharge status: Home / Self Care | Payer: Medicare Other | Source: Ambulatory Visit | Attending: Radiation Oncology | Admitting: Radiation Oncology

## 2013-01-07 ENCOUNTER — Ambulatory Visit: Payer: Medicare Other

## 2013-01-08 ENCOUNTER — Ambulatory Visit: Payer: Medicare Other

## 2013-01-08 ENCOUNTER — Ambulatory Visit
Admission: RE | Admit: 2013-01-08 | Discharge: 2013-01-08 | Disposition: A | Discharge status: Home / Self Care | Payer: Medicare Other | Source: Ambulatory Visit | Attending: Radiation Oncology | Admitting: Radiation Oncology

## 2013-01-09 ENCOUNTER — Ambulatory Visit
Admission: RE | Admit: 2013-01-09 | Discharge: 2013-01-09 | Disposition: A | Discharge status: Home / Self Care | Payer: Medicare Other | Source: Ambulatory Visit | Attending: Radiation Oncology | Admitting: Radiation Oncology

## 2013-01-09 ENCOUNTER — Ambulatory Visit: Payer: Medicare Other

## 2013-01-10 ENCOUNTER — Ambulatory Visit: Payer: Medicare Other

## 2013-01-10 ENCOUNTER — Ambulatory Visit
Admission: RE | Admit: 2013-01-10 | Discharge: 2013-01-10 | Disposition: A | Discharge status: Home / Self Care | Payer: Medicare Other | Source: Ambulatory Visit | Attending: Radiation Oncology | Admitting: Radiation Oncology

## 2013-01-13 ENCOUNTER — Ambulatory Visit: Payer: Medicare Other

## 2013-01-13 ENCOUNTER — Ambulatory Visit: Admission: RE | Admit: 2013-01-13 | Payer: Medicare Other | Source: Ambulatory Visit

## 2013-01-13 ENCOUNTER — Ambulatory Visit
Admission: RE | Admit: 2013-01-13 | Discharge: 2013-01-13 | Disposition: A | Discharge status: Home / Self Care | Payer: Medicare Other | Source: Ambulatory Visit | Attending: Radiation Oncology | Admitting: Radiation Oncology

## 2013-01-13 VITALS — BP 140/80 | HR 86 | Temp 97.9°F | Wt 177.9 lb

## 2013-01-13 NOTE — Progress Notes (Signed)
Weekly Management Note:  Site: Proximal left lower extremity Current Dose:  3780  cGy Projected Dose: 5040  CGy followed by boost  Narrative: The patient is seen today for routine under treatment assessment. CBCT/MVCT images/port films were reviewed. The chart was reviewed.   He is seen today prior to his treatment and is noted to have a moist desquamation along his left groin fold along with a dry desquamation along his superolateral scrotum. His discomfort is tolerable. His body/genitalia contour seen on his MV CT has changed .  Physical Examination:  Filed Vitals:   01/13/13 1557  BP: 140/80  Pulse: 86  Temp: 97.9 F (36.6 C)  .  Weight: 177 lb 14.4 oz (80.695 kg). There is focal moist disclamation along his left inguinal fold with dry desquamation along the lateral scrotum. There is no genital edema. There is hyperpigmentation along the thigh as expected with no other areas of desquamation.  Impression: Tolerating radiation therapy well, , however I am concerned about dose going to his genitalia. I spoke with physics and we will try to replan him with either Tomotherapy or static IMRT treatment fields. He'll be rested for at least a few days.  Plan: Continue radiation therapy after replanning.

## 2013-01-13 NOTE — Progress Notes (Signed)
Patient here for weekly assessment of left inner thigh.Treatment held as patient skin is irritated and painful.To see Dr.Murray for further direction on skincare and break from radiation treatment.

## 2013-01-14 ENCOUNTER — Ambulatory Visit: Payer: Medicare Other

## 2013-01-15 ENCOUNTER — Ambulatory Visit: Payer: Medicare Other

## 2013-01-15 ENCOUNTER — Ambulatory Visit: Admission: RE | Admit: 2013-01-15 | Payer: Medicare Other | Source: Ambulatory Visit

## 2013-01-16 ENCOUNTER — Ambulatory Visit: Admission: RE | Admit: 2013-01-16 | Payer: Medicare Other | Source: Ambulatory Visit

## 2013-01-16 ENCOUNTER — Encounter: Payer: Self-pay | Admitting: Radiation Oncology

## 2013-01-16 ENCOUNTER — Ambulatory Visit: Payer: Medicare Other

## 2013-01-16 NOTE — Progress Notes (Signed)
Chart note: Mr. Luis Brewer was changed to static beam IMRT to improve the ease of image guidance introduce dose to the genitalia. He'll resume his radiation therapy today.

## 2013-01-17 ENCOUNTER — Ambulatory Visit: Payer: Medicare Other

## 2013-01-17 ENCOUNTER — Telehealth: Payer: Self-pay | Admitting: Radiation Oncology

## 2013-01-17 ENCOUNTER — Ambulatory Visit
Admission: RE | Admit: 2013-01-17 | Discharge: 2013-01-17 | Disposition: A | Discharge status: Home / Self Care | Payer: Medicare Other | Source: Ambulatory Visit | Attending: Radiation Oncology | Admitting: Radiation Oncology

## 2013-01-17 NOTE — Telephone Encounter (Signed)
Pt called and wanted me to call [place] [employer] to check the status of his application.  Information I received, as follows:   Pt's Medicaid application was received at [facility]) on 12/20/2012 and is processing with assigned Caseworker, Barnett Abu.  Her direct contact number is [phone].   DSS Address Location: [facility] [address] [place] [address] [phone]

## 2013-01-20 ENCOUNTER — Ambulatory Visit: Payer: Medicare Other

## 2013-01-21 ENCOUNTER — Ambulatory Visit: Admission: RE | Admit: 2013-01-21 | Payer: Medicare Other | Source: Ambulatory Visit

## 2013-01-21 ENCOUNTER — Ambulatory Visit: Payer: Medicare Other

## 2013-01-22 ENCOUNTER — Ambulatory Visit
Admission: RE | Admit: 2013-01-22 | Discharge: 2013-01-22 | Disposition: A | Discharge status: Home / Self Care | Payer: Medicare Other | Source: Ambulatory Visit | Attending: Radiation Oncology | Admitting: Radiation Oncology

## 2013-01-22 ENCOUNTER — Ambulatory Visit: Payer: Medicare Other

## 2013-01-22 ENCOUNTER — Encounter: Payer: Self-pay | Admitting: Radiation Oncology

## 2013-01-22 NOTE — Progress Notes (Signed)
Simulation verification note: The patient underwent simulation verification for his IMRT to his left thigh. The image registration is excellent.

## 2013-01-23 ENCOUNTER — Ambulatory Visit: Payer: Medicare Other

## 2013-01-23 ENCOUNTER — Ambulatory Visit: Payer: Medicare Other | Admitting: Radiation Oncology

## 2013-01-23 ENCOUNTER — Ambulatory Visit
Admission: RE | Admit: 2013-01-23 | Discharge: 2013-01-23 | Disposition: A | Discharge status: Home / Self Care | Payer: Medicare Other | Source: Ambulatory Visit | Attending: Radiation Oncology | Admitting: Radiation Oncology

## 2013-01-24 ENCOUNTER — Ambulatory Visit: Payer: Medicare Other

## 2013-01-27 ENCOUNTER — Ambulatory Visit
Admission: RE | Admit: 2013-01-27 | Discharge: 2013-01-27 | Disposition: A | Discharge status: Home / Self Care | Payer: Medicare Other | Source: Ambulatory Visit | Attending: Radiation Oncology | Admitting: Radiation Oncology

## 2013-01-27 ENCOUNTER — Ambulatory Visit: Payer: Medicare Other
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
## 2013-01-28 ENCOUNTER — Ambulatory Visit
Admission: RE | Admit: 2013-01-28 | Discharge: 2013-01-28 | Disposition: A | Discharge status: Home / Self Care | Payer: Medicare Other | Source: Ambulatory Visit | Attending: Radiation Oncology | Admitting: Radiation Oncology

## 2013-01-28 ENCOUNTER — Ambulatory Visit: Payer: Medicare Other

## 2013-01-29 ENCOUNTER — Ambulatory Visit
Admission: RE | Admit: 2013-01-29 | Discharge: 2013-01-29 | Disposition: A | Discharge status: Home / Self Care | Payer: Medicare Other | Source: Ambulatory Visit | Attending: Radiation Oncology | Admitting: Radiation Oncology

## 2013-01-29 ENCOUNTER — Ambulatory Visit: Payer: Medicare Other

## 2013-01-30 ENCOUNTER — Ambulatory Visit
Admission: RE | Admit: 2013-01-30 | Discharge: 2013-01-30 | Disposition: A | Discharge status: Home / Self Care | Payer: Medicare Other | Source: Ambulatory Visit | Attending: Radiation Oncology | Admitting: Radiation Oncology

## 2013-01-30 ENCOUNTER — Encounter: Payer: Self-pay | Admitting: Radiation Oncology

## 2013-01-30 ENCOUNTER — Ambulatory Visit: Payer: Medicare Other

## 2013-01-30 NOTE — Progress Notes (Signed)
Chart note: Luis Brewer underwent his reduced IMRT the field planning today. He is being treated to 8 static IMRT beams with 8 dosimetry calculations. I'm prescribing an additional 1260 cGy 7 sessions utilizing 6 MV photons.

## 2013-01-31 ENCOUNTER — Ambulatory Visit
Admission: RE | Admit: 2013-01-31 | Discharge: 2013-01-31 | Disposition: A | Discharge status: Home / Self Care | Payer: Medicare Other | Source: Ambulatory Visit | Attending: Radiation Oncology | Admitting: Radiation Oncology

## 2013-01-31 ENCOUNTER — Ambulatory Visit: Payer: Medicare Other

## 2013-02-03 ENCOUNTER — Ambulatory Visit
Admission: RE | Admit: 2013-02-03 | Discharge: 2013-02-03 | Disposition: A | Discharge status: Home / Self Care | Payer: Medicare Other | Source: Ambulatory Visit | Attending: Radiation Oncology | Admitting: Radiation Oncology

## 2013-02-03 ENCOUNTER — Ambulatory Visit: Payer: Medicare Other

## 2013-02-03 VITALS — BP 165/89 | HR 87 | Temp 97.5°F | Wt 177.2 lb

## 2013-02-03 NOTE — Progress Notes (Signed)
Luis Brewer states that the skin on his thigh is red but improving.  He is applying neosporin twice a day to the treatment area.

## 2013-02-03 NOTE — Progress Notes (Signed)
   Weekly Management Note:  outpatient Current Dose:  52.2Gy  Projected Dose: 50.4  Gy  + 12.6 Gy  Narrative:  The patient presents for routine under treatment assessment.  CBCT/MVCT images/Port film x-rays were reviewed.  The chart was checked. He reports the irritation in his groin is better - applying neosporin there  Physical Findings:  weight is 177 lb 3.2 oz (80.377 kg). His temperature is 97.5 F (36.4 C). His blood pressure is 165/89 and his pulse is 87.  moist desquamation in left groin - no infection - skin islands regenerating  Impression:  The patient is tolerating radiotherapy.  Plan:  Continue radiotherapy as planned.  ________________________________   Luis Brewer, M.D.

## 2013-02-03 NOTE — Progress Notes (Signed)
Mr. Reierson here for weekly ut visit for radiation treatment to his left thigh.  He denies pain and fatigue at this time.

## 2013-02-04 ENCOUNTER — Ambulatory Visit
Admission: RE | Admit: 2013-02-04 | Discharge: 2013-02-04 | Disposition: A | Discharge status: Home / Self Care | Payer: Medicare Other | Source: Ambulatory Visit | Attending: Radiation Oncology | Admitting: Radiation Oncology

## 2013-02-04 ENCOUNTER — Ambulatory Visit: Payer: Medicare Other

## 2013-02-05 ENCOUNTER — Ambulatory Visit
Admission: RE | Admit: 2013-02-05 | Discharge: 2013-02-05 | Disposition: A | Discharge status: Home / Self Care | Payer: Medicare Other | Source: Ambulatory Visit | Attending: Radiation Oncology | Admitting: Radiation Oncology

## 2013-02-06 ENCOUNTER — Ambulatory Visit
Admission: RE | Admit: 2013-02-06 | Discharge: 2013-02-06 | Disposition: A | Discharge status: Home / Self Care | Payer: Medicare Other | Source: Ambulatory Visit | Attending: Radiation Oncology | Admitting: Radiation Oncology

## 2013-02-07 ENCOUNTER — Ambulatory Visit
Admission: RE | Admit: 2013-02-07 | Discharge: 2013-02-07 | Disposition: A | Discharge status: Home / Self Care | Payer: Medicare Other | Source: Ambulatory Visit | Attending: Radiation Oncology | Admitting: Radiation Oncology

## 2013-02-10 ENCOUNTER — Ambulatory Visit
Admission: RE | Admit: 2013-02-10 | Discharge: 2013-02-10 | Disposition: A | Discharge status: Home / Self Care | Payer: Medicare Other | Source: Ambulatory Visit | Attending: Radiation Oncology | Admitting: Radiation Oncology

## 2013-02-10 VITALS — BP 177/97 | HR 70 | Temp 97.8°F | Wt 178.8 lb

## 2013-02-10 DIAGNOSIS — C4922 Malignant neoplasm of connective and soft tissue of left lower limb, including hip: Secondary | ICD-10-CM

## 2013-02-10 NOTE — Progress Notes (Signed)
Weekly Management Note:  Site: Left thigh Current Dose:  6120  cGy Projected Dose: 6300  cGy  Narrative: The patient is seen today for routine under treatment assessment. CBCT/MVCT images/port films were reviewed. The chart was reviewed.   Treatment setup is excellent. No complaints today.  Physical Examination:  Filed Vitals:   02/10/13 1704  BP: 177/97  Pulse: 70  Temp: 97.8 F (36.6 C)  .  Weight: 178 lb 12.8 oz (81.103 kg). There is dry desquamation the skin along the left inguinal crease with hyperpigmentation the skin within the treatment field. There is no lower extremity lymphedema.  Impression: Tolerating radiation therapy well. I told him he could stop his Neosporin along the left groin.  Plan: Continue radiation therapy as planned. He'll finish his radiation therapy tomorrow. One-month followup visit. I understand that he will visit his physicians at [facility] on June 6.

## 2013-02-10 NOTE — Progress Notes (Signed)
Patient here for routine weekly assessment of radiation to left thigh.States skin has just about healed.Continues application of neosporin twice daily.Very mild discomfort and denies fatigue.Treatment to completed on tomorrow.

## 2013-02-11 ENCOUNTER — Ambulatory Visit
Admission: RE | Admit: 2013-02-11 | Discharge: 2013-02-11 | Disposition: A | Discharge status: Home / Self Care | Payer: Medicare Other | Source: Ambulatory Visit | Attending: Radiation Oncology | Admitting: Radiation Oncology

## 2013-02-11 ENCOUNTER — Encounter: Payer: Self-pay | Admitting: Radiation Oncology
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
## 2013-02-12 ENCOUNTER — Ambulatory Visit: Payer: Medicare Other

## 2013-02-13 ENCOUNTER — Encounter: Payer: Self-pay | Admitting: Radiation Oncology

## 2013-02-13 ENCOUNTER — Ambulatory Visit: Payer: Medicare Other

## 2013-02-13 NOTE — Progress Notes (Signed)
Complex simulation note: The patient was taken back to the CT simulator on 01/17/2013 Simulation was required because of his anatomic changes along his genitalia. A new VAC LOC immobilization device was constructed as well. He was scanned from his pelvis to his left lower extremity . The CT data set was then fused with his previous contours for IMRT simulation/planning.

## 2013-02-14 ENCOUNTER — Ambulatory Visit: Payer: Medicare Other

## 2013-03-14 ENCOUNTER — Encounter: Payer: Self-pay | Admitting: Oncology

## 2013-03-18 ENCOUNTER — Ambulatory Visit
Admission: RE | Admit: 2013-03-18 | Discharge: 2013-03-18 | Disposition: A | Discharge status: Home / Self Care | Payer: Medicare Other | Source: Ambulatory Visit | Attending: Radiation Oncology | Admitting: Radiation Oncology

## 2013-03-18 VITALS — BP 134/80 | HR 75 | Temp 97.4°F | Ht 69.0 in | Wt 177.8 lb

## 2013-03-18 DIAGNOSIS — C4922 Malignant neoplasm of connective and soft tissue of left lower limb, including hip: Secondary | ICD-10-CM

## 2013-03-18 DIAGNOSIS — Z923 Personal history of irradiation: Secondary | ICD-10-CM | POA: Insufficient documentation

## 2013-03-18 DIAGNOSIS — C4920 Malignant neoplasm of connective and soft tissue of unspecified lower limb, including hip: Secondary | ICD-10-CM | POA: Insufficient documentation

## 2013-03-18 MED ORDER — RADIAPLEXRX EX GEL
Freq: Once | CUTANEOUS | Status: AC
  Administered 2013-03-18: 1 via TOPICAL

## 2013-03-18 NOTE — Progress Notes (Signed)
CC:   Dr. Josefine Class (fax [phone]), Dr. Francis Gaines (fax [phone]), Dr. Oliver Barre  Followup note: Luis Brewer returns today approximately 1 month following completion of postoperative IMRT in the management of his stage III (T2 B. 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 He is doing quite well and his performance status is excellent. He still uses a 4 prong cane to get up from a sitting position but he has no difficulty ambulating. He has minimal leg discomfort. He sees Dr. Andrey Campanile on June 6 after a MRI scan earlier today.  His examination: Alert and oriented. He is in good spirits. Filed Vitals:   03/18/13 1515  BP: 134/80  Pulse: 75  Temp: 97.4 F (36.3 C)   Abdomen: Without masses organomegaly. Nodes: Without palpable inguinal or femoral adenopathy. Surgical changes are noted along the left medial thigh with residual hyperpigmentation and dry desquamation. There is no visible or palpable evidence for recurrent disease. There is no peripheral edema.  Impression: Satisfactory progress. No evidence for recurrent disease.  Plan: Followup visit with MRI with Dr. Andrey Campanile on June 6. Follow visit with me in 3 months. I asked that Dr. Andrey Campanile keep me posted on his progress.

## 2013-03-18 NOTE — Progress Notes (Signed)
Luis Brewer is here for a follow up after 35 fractions to his left thigh.  He denies pain but states he does have some pain with activity in his left thigh.  He denies fatigue.  He reports that the skin on his left thigh is peeling and slowly returning to normal.  He would like some more radiaplex gel.

## 2013-03-18 NOTE — Addendum Note (Signed)
Encounter addended by: Eduardo Osier, RN on: 03/18/2013  4:39 PM<BR>     Documentation filed: Inpatient MAR

## 2013-03-18 NOTE — Addendum Note (Signed)
Encounter addended by: Eduardo Osier, RN on: 03/18/2013  3:59 PM<BR>     Documentation filed: Orders

## 2013-05-09 ENCOUNTER — Telehealth: Payer: Self-pay | Admitting: Radiation Oncology

## 2013-05-09 NOTE — Telephone Encounter (Signed)
Faxed NPE 09/10/12; FUP 11/21/12, EOT 02/11/13, FUP 03/18/13, path 08/29/12 to [employer], [phone] (Fax # on request was not legible).  Received confirmation.  OK per JDK.

## 2013-06-17 ENCOUNTER — Ambulatory Visit
Admission: RE | Admit: 2013-06-17 | Discharge: 2013-06-17 | Disposition: A | Discharge status: Home / Self Care | Payer: Medicare Other | Source: Ambulatory Visit | Attending: Radiation Oncology | Admitting: Radiation Oncology

## 2013-06-17 VITALS — BP 119/74 | HR 83 | Temp 97.4°F | Ht 69.0 in | Wt 170.0 lb

## 2013-06-17 DIAGNOSIS — C4922 Malignant neoplasm of connective and soft tissue of left lower limb, including hip: Secondary | ICD-10-CM

## 2013-06-17 NOTE — Progress Notes (Signed)
Luis Brewer here for follow up after treatment for a liposarcoma in his left adductor magnus.  He does have soreness in his left thigh that he is rating at a 6/10.  His gate is steady and he is not using a cane.  He does have fatigue.  The skin on his left inner thigh is intact with some hyperpigmentation.  He is using radiaplex gel and would like a refill.

## 2013-06-17 NOTE — Progress Notes (Signed)
CC: Dr. Josefine Class (fax [phone]), Dr. Francis Gaines (fax [phone]), Dr. Oliver Barre  Followup note:  Luis Brewer returns today approximately 4 months following completion of postoperative radiation therapy in the management of his stage III (T2 B. 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 He is been quite active and stop using his cane. He really did not have any significant postoperative rehabilitation. He tells me that he was helping a friend replace a floor a week ago and has been sore along his left thigh since then. His discomfort is slowly improving. He tells me that he saw Dr. Andrey Campanile at [facility] this past December and had a chest x-ray and MRI scan of his left thigh. He was given a good report. He'll see Dr. Andrey Campanile again in December of 2014.  Physical examination: Alert and oriented. He is in good spirits. Wt Readings from Last 3 Encounters:  06/17/13 170 lb (77.111 kg)  03/18/13 177 lb 12.8 oz (80.65 kg)  02/10/13 178 lb 12.8 oz (81.103 kg)   Temp Readings from Last 3 Encounters:  06/17/13 97.4 F (36.3 C)   03/18/13 97.4 F (36.3 C)   02/10/13 97.8 F (36.6 C)    BP Readings from Last 3 Encounters:  06/17/13 119/74  03/18/13 134/80  02/10/13 177/97   Pulse Readings from Last 3 Encounters:  06/17/13 83  03/18/13 75  02/10/13 70   Head and neck examination: Grossly unremarkable. Nodes: Without palpable supraclavicular, inguinal or femoral lymphadenopathy. Abdomen: Soft without masses organomegaly. Extremities: There is residual hyperpigmentation the skin along the left lower abdomen/groin/left medial thigh. There is a fair amount of induration/fibrosis along his left groin and left thigh along with surgical changes. No visible or palpable evidence for recurrent disease. There is no edema of the distal left lower extremity.  Impression: Satisfactory progress with no evidence for recurrent disease.  Plan: He will keep  his followup with Dr. Andrey Campanile in December, and understand he will have a chest x-ray close to home in [facility] prior to his visit with Dr. Andrey Campanile. If his left thigh pain persisted he is to see Dr. Andrey Campanile. Follow visit with me in June of 2015.

## 2014-04-28 ENCOUNTER — Ambulatory Visit: Payer: Medicare Other | Admitting: Radiation Oncology

## 2014-04-29 ENCOUNTER — Encounter: Payer: Self-pay | Admitting: *Deleted

## 2014-05-05 ENCOUNTER — Telehealth: Payer: Self-pay | Admitting: *Deleted

## 2014-05-05 ENCOUNTER — Ambulatory Visit: Payer: Medicare Other | Attending: Radiation Oncology | Admitting: Radiation Oncology

## 2014-05-05 NOTE — Telephone Encounter (Signed)
Called patient and left vm, re: patient missed FU appointment today w/Dr Valere Dross. Requested pt call back to reschedule, left 2 contact numbers for pt to call.

## 2014-05-06 ENCOUNTER — Encounter: Payer: Self-pay | Admitting: *Deleted

## 2016-06-06 ENCOUNTER — Telehealth: Payer: Self-pay | Admitting: Internal Medicine

## 2016-06-06 NOTE — Telephone Encounter (Signed)
Pt daughter called in and wanted to know if you could take him back on as a pt?

## 2016-06-06 NOTE — Telephone Encounter (Signed)
Ok with me, but you might let him know that as I do not treat chronic pain, he would need to be referred to pain management if this is needed (for meds like vicodin or stonger)

## 2016-06-20 ENCOUNTER — Encounter: Payer: Self-pay | Admitting: Family Medicine

## 2016-06-20 ENCOUNTER — Ambulatory Visit (INDEPENDENT_AMBULATORY_CARE_PROVIDER_SITE_OTHER): Payer: Medicare Other | Admitting: Family Medicine

## 2016-06-20 VITALS — BP 142/89 | HR 81 | Ht 68.5 in | Wt 156.3 lb

## 2016-06-20 DIAGNOSIS — E0800 Diabetes mellitus due to underlying condition with hyperosmolarity without nonketotic hyperglycemic-hyperosmolar coma (NKHHC): Secondary | ICD-10-CM | POA: Diagnosis not present

## 2016-06-20 DIAGNOSIS — F1010 Alcohol abuse, uncomplicated: Secondary | ICD-10-CM

## 2016-06-20 DIAGNOSIS — F17200 Nicotine dependence, unspecified, uncomplicated: Secondary | ICD-10-CM

## 2016-06-20 DIAGNOSIS — Z130 Encounter for screening for diseases of the blood and blood-forming organs and certain disorders involving the immune mechanism: Secondary | ICD-10-CM

## 2016-06-20 DIAGNOSIS — R5383 Other fatigue: Secondary | ICD-10-CM

## 2016-06-20 DIAGNOSIS — Z720 Tobacco use: Secondary | ICD-10-CM

## 2016-06-20 DIAGNOSIS — E785 Hyperlipidemia, unspecified: Secondary | ICD-10-CM

## 2016-06-20 DIAGNOSIS — R413 Other amnesia: Secondary | ICD-10-CM

## 2016-06-20 DIAGNOSIS — C801 Malignant (primary) neoplasm, unspecified: Secondary | ICD-10-CM | POA: Diagnosis not present

## 2016-06-20 DIAGNOSIS — I10 Essential (primary) hypertension: Secondary | ICD-10-CM | POA: Diagnosis not present

## 2016-06-20 DIAGNOSIS — Z0000 Encounter for general adult medical examination without abnormal findings: Secondary | ICD-10-CM

## 2016-06-20 DIAGNOSIS — Z113 Encounter for screening for infections with a predominantly sexual mode of transmission: Secondary | ICD-10-CM
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
## 2016-06-20 NOTE — Patient Instructions (Addendum)
[facility], [place], [place]- [facility] treatment center. [place], [place] [address] Phone: [phone]   Alcohol Abuse and Nutrition Alcohol abuse is any pattern of alcohol consumption that harms your health, relationships, or work. Alcohol abuse can affect how your body breaks down and absorbs nutrients from food by causing your liver to work abnormally. Additionally, many people who abuse alcohol do not eat enough carbohydrates, protein, fat, vitamins, and minerals. This can cause poor nutrition (malnutrition) and a lack of nutrients (nutrient deficiencies), which can lead to further complications. Nutrients that are commonly lacking (deficient) among people who abuse alcohol include:  Vitamins.  Vitamin A. This is stored in your liver. It is important for your vision, metabolism, and ability to fight off infections (immunity).  B vitamins. These include vitamins such as folate, thiamin, and niacin. These are important in new cell growth and maintenance.  Vitamin C. This plays an important role in iron absorption, wound healing, and immunity.  Vitamin D. This is produced by your liver, but you can also get vitamin D from food. Vitamin D is necessary for your body to absorb and use calcium.  Minerals.  Calcium. This is important for your bones and your heart and blood vessel (cardiovascular) function.  Iron. This is important for blood, muscle, and nervous system functioning.  Magnesium. This plays an important role in muscle and nerve function, and it helps to control blood sugar and blood pressure.  Zinc. This is important for the normal function of your nervous system and digestive system (gastrointestinal tract). Nutrition is an essential component of therapy for alcohol abuse. Your health care provider or dietitian will work with you to design a plan that can help restore nutrients to your body and prevent potential complications. WHAT IS MY PLAN? Your  dietitian may develop a specific diet plan that is based on your condition and any other complications you may have. A diet plan will commonly include:  A balanced diet.  Grains: 6-8 oz per day.  Vegetables: 2-3 cups per day.  Fruits: 1-2 cups per day.  Meat and other protein: 5-6 oz per day.  Dairy: 2-3 cups per day.  Vitamin and mineral supplements. WHAT DO I NEED TO KNOW ABOUT ALCOHOL AND NUTRITION?  Consume foods that are high in antioxidants, such as grapes, berries, nuts, green tea, and dark green and orange vegetables. This can help to counteract some of the stress that is placed on your liver by consuming alcohol.  Avoid food and drinks that are high in fat and sugar. Foods such as sugared soft drinks, salty snack foods, and candy contain empty calories. This means that they lack important nutrients such as protein, fiber, and vitamins.  Eat frequent meals and snacks. Try to eat 5-6 small meals each day.  Eat a variety of fresh fruits and vegetables each day. This will help you get plenty of water, fiber, and vitamins in your diet.  Drink plenty of water and other clear fluids. Try to drink at least 48-64 oz (1.5-2 L) of water per day.  If you are a vegetarian, eat a variety of protein-rich foods. Pair whole grains with plant-based proteins at meals and snacks to obtain the greatest nutrient benefit from your food. For example, eat rice with beans, put peanut butter on whole-grain toast, or eat oatmeal with sunflower seeds.  Soak beans and whole grains overnight before cooking. This can help your body to absorb the nutrients more easily.  Include foods fortified with  vitamins and minerals in your diet. Commonly fortified foods include milk, orange juice, cereal, and bread.  If you are malnourished, your dietitian may recommend a high-protein, high-calorie diet. This may include:  2,000-3,000 calories (kilocalories) per day.  70-100 grams of protein per day.  Your health  care provider may recommend a complete nutritional supplement beverage. This can help to restore calories, protein, and vitamins to your body. Depending on your condition, you may be advised to consume this instead of or in addition to meals.  Limit your intake of caffeine. Replace drinks like coffee and black tea with decaffeinated coffee and herbal tea.  Eat a variety of foods that are high in omega fatty acids. These include fish, nuts and seeds, and soybeans. These foods may help your liver to recover and may also stabilize your mood.  Certain medicines may cause changes in your appetite, taste, and weight. Work with your health care provider and dietitian to make any adjustments to your medicines and diet plan.  Include other healthy lifestyle choices in your daily routine.  Be physically active.  Get enough sleep.  Spend time doing activities that you enjoy.  If you are unable to take in enough food and calories by mouth, your health care provider may recommend a feeding tube. This is a tube that passes through your nose and throat, directly into your stomach. Nutritional supplement beverages can be given to you through the feeding tube to help you get the nutrients you need.  Take vitamin or mineral supplements as recommended by your health care provider. WHAT FOODS CAN I EAT? Grains Enriched pasta. Enriched rice. Fortified whole-grain bread. Fortified whole-grain cereal. Barley. Brown rice. Quinoa. [place]. Vegetables All fresh, frozen, and canned vegetables. Spinach. Kale. Artichoke. Carrots. Winter squash and pumpkin. Sweet potatoes. Broccoli. Cabbage. Cucumbers. Tomatoes. Sweet peppers. Green beans. Peas. Corn. Fruits All fresh and frozen fruits. Berries. Grapes. Mango. Papaya. Guava. Cherries. Apples. Bananas. Peaches. Plums. Pineapple. Watermelon. Cantaloupe. Oranges. Avocado. Meats and Other Protein Sources Beef liver. Lean beef. Pork. Fresh and canned chicken. Fresh fish.  Oysters. Sardines. Canned tuna. Shrimp. Eggs with yolks. Nuts and seeds. Peanut butter. Beans and lentils. Soybeans. Tofu. Dairy Whole, low-fat, and nonfat milk. Whole, low-fat, and nonfat yogurt. Cottage cheese. Sour cream. Hard and soft cheeses. Beverages Water. Herbal tea. Decaffeinated coffee. Decaffeinated green tea. 100% fruit juice. 100% vegetable juice. Instant breakfast shakes. Condiments Ketchup. Mayonnaise. Mustard. Salad dressing. Barbecue sauce. Sweets and Desserts Sugar-free ice cream. Sugar-free pudding. Sugar-free gelatin. Fats and Oils Butter. Vegetable oil, flaxseed oil, olive oil, and walnut oil. Other Complete nutrition shakes. Protein bars. Sugar-free gum. The items listed above may not be a complete list of recommended foods or beverages. Contact your dietitian for more options. WHAT FOODS ARE NOT RECOMMENDED? Grains Sugar-sweetened breakfast cereals. Flavored instant oatmeal. Fried breads. Vegetables Breaded or deep-fried vegetables. Fruits Dried fruit with added sugar. Candied fruit. Canned fruit in syrup. Meats and Other Protein Sources Breaded or deep-fried meats. Dairy Flavored milks. Fried cheese curds or fried cheese sticks. Beverages Alcohol. Sugar-sweetened soft drinks. Sugar-sweetened tea. Caffeinated coffee and tea. Condiments Sugar. Honey. Agave nectar. Molasses. Sweets and Desserts Chocolate. Cake. Cookies. Candy. Other Potato chips. Pretzels. Salted nuts. Candied nuts. The items listed above may not be a complete list of foods and beverages to avoid. Contact your dietitian for more information.   This information is not intended to replace advice given to you by your health care provider. Make sure you discuss any questions you have  with your health care provider.   Document Released: 08/31/2005 Document Revised: 11/27/2014 Document Reviewed: 06/09/2014 Elsevier Interactive Patient Education [employer].

## 2016-06-22 ENCOUNTER — Other Ambulatory Visit (INDEPENDENT_AMBULATORY_CARE_PROVIDER_SITE_OTHER): Payer: Medicare Other

## 2016-06-22 DIAGNOSIS — E0800 Diabetes mellitus due to underlying condition with hyperosmolarity without nonketotic hyperglycemic-hyperosmolar coma (NKHHC): Secondary | ICD-10-CM | POA: Diagnosis not present

## 2016-06-22 DIAGNOSIS — R5383 Other fatigue: Secondary | ICD-10-CM | POA: Diagnosis not present

## 2016-06-22 DIAGNOSIS — E785 Hyperlipidemia, unspecified: Secondary | ICD-10-CM | POA: Diagnosis not present

## 2016-06-22 DIAGNOSIS — F1010 Alcohol abuse, uncomplicated: Secondary | ICD-10-CM

## 2016-06-22 LAB — POCT GLYCOSYLATED HEMOGLOBIN (HGB A1C): Hemoglobin A1C: 6.2

## 2016-06-23 LAB — MAGNESIUM: MAGNESIUM: 1.7 mg/dL (ref 1.5–2.5)

## 2016-06-23 LAB — COMPREHENSIVE METABOLIC PANEL
ALBUMIN: 4.1 g/dL (ref 3.6–5.1)
ALK PHOS: 55 U/L (ref 40–115)
ALT: 46 U/L (ref 9–46)
AST: 53 U/L — ABNORMAL HIGH (ref 10–35)
BUN: 7 mg/dL (ref 7–25)
CO2: 21 mmol/L (ref 20–31)
CREATININE: 0.78 mg/dL (ref 0.70–1.25)
Calcium: 9.8 mg/dL (ref 8.6–10.3)
Chloride: 98 mmol/L (ref 98–110)
Glucose, Bld: 116 mg/dL — ABNORMAL HIGH (ref 65–99)
Potassium: 5.2 mmol/L (ref 3.5–5.3)
SODIUM: 133 mmol/L — AB (ref 135–146)
TOTAL PROTEIN: 7.7 g/dL (ref 6.1–8.1)
Total Bilirubin: 0.6 mg/dL (ref 0.2–1.2)

## 2016-06-23 LAB — MICROALBUMIN / CREATININE URINE RATIO
Creatinine, Urine: 130 mg/dL (ref 20–370)
Microalb Creat Ratio: 23 mcg/mg creat (ref <30)
Microalb, Ur: 3 mg/dL

## 2016-06-23 LAB — SEDIMENTATION RATE: SED RATE: 20 mm/h (ref 0–20)

## 2016-06-23 LAB — FOLATE: FOLATE: 16.9 ng/mL (ref >5.4)

## 2016-06-23 LAB — CBC WITH DIFFERENTIAL/PLATELET
BASOS PCT: 1 %
Basophils Absolute: 56 cells/uL (ref 0–200)
EOS ABS: 168 {cells}/uL (ref 15–500)
EOS PCT: 3 %
HCT: 43.3 % (ref 38.5–50.0)
HEMOGLOBIN: 14.6 g/dL (ref 13.2–17.1)
LYMPHS ABS: 2072 {cells}/uL (ref 850–3900)
Lymphocytes Relative: 37 %
MCH: 32.7 pg (ref 27.0–33.0)
MCHC: 33.7 g/dL (ref 32.0–36.0)
MCV: 96.9 fL (ref 80.0–100.0)
MONOS PCT: 11 %
MPV: 9.3 fL (ref 7.5–12.5)
Monocytes Absolute: 616 cells/uL (ref 200–950)
NEUTROS ABS: 2688 {cells}/uL (ref 1500–7800)
Neutrophils Relative %: 48 %
PLATELETS: 292 10*3/uL (ref 140–400)
RBC: 4.47 MIL/uL (ref 4.20–5.80)
RDW: 12.7 % (ref 11.0–15.0)
WBC: 5.6 10*3/uL (ref 3.8–10.8)

## 2016-06-23 LAB — VITAMIN B12: Vitamin B-12: 500 pg/mL (ref 200–1100)

## 2016-06-23 LAB — LIPID PANEL
CHOL/HDL RATIO: 3.4 ratio (ref <=5.0)
CHOLESTEROL: 179 mg/dL (ref 125–200)
HDL: 53 mg/dL (ref >=40)
LDL Cholesterol: 115 mg/dL (ref <130)
Triglycerides: 55 mg/dL (ref <150)
VLDL: 11 mg/dL (ref <30)

## 2016-06-23 LAB — PHOSPHORUS: PHOSPHORUS: 3.7 mg/dL (ref 2.1–4.3)

## 2016-06-23 LAB — RPR

## 2016-06-23 LAB — HIV ANTIBODY (ROUTINE TESTING W REFLEX): HIV 1&2 Ab, 4th Generation: NONREACTIVE

## 2016-06-23 LAB — TSH: TSH: 0.96 mIU/L (ref 0.40–4.50)

## 2016-06-24 DIAGNOSIS — Z113 Encounter for screening for infections with a predominantly sexual mode of transmission: Secondary | ICD-10-CM | POA: Insufficient documentation

## 2016-06-24 DIAGNOSIS — Z130 Encounter for screening for diseases of the blood and blood-forming organs and certain disorders involving the immune mechanism: Secondary | ICD-10-CM | POA: Insufficient documentation

## 2016-06-24 DIAGNOSIS — Z0000 Encounter for general adult medical examination without abnormal findings: Secondary | ICD-10-CM | POA: Insufficient documentation

## 2016-06-24 DIAGNOSIS — R413 Other amnesia: Secondary | ICD-10-CM | POA: Insufficient documentation

## 2016-06-24 DIAGNOSIS — F17200 Nicotine dependence, unspecified, uncomplicated: Secondary | ICD-10-CM | POA: Insufficient documentation

## 2016-06-24 NOTE — Progress Notes (Signed)
Please inform patient he needs a follow-up office visit to review his recent labs with me.  There are some abnormalities that we should discuss.     Thanks, Dr Jenetta Downer

## 2016-06-24 NOTE — Assessment & Plan Note (Signed)
Patient with history of left thigh liposarcoma, stage IIIB.    Advised he follow up with his specialists at [facility] for this. Explained he may need routine surveillance and screening tests.

## 2016-07-25 ENCOUNTER — Ambulatory Visit: Payer: Medicare Other | Admitting: Family Medicine

## 2016-08-09 ENCOUNTER — Encounter: Payer: Self-pay | Admitting: Family Medicine

## 2016-08-09 ENCOUNTER — Ambulatory Visit (INDEPENDENT_AMBULATORY_CARE_PROVIDER_SITE_OTHER): Payer: Medicare Other | Admitting: Family Medicine

## 2016-08-09 VITALS — BP 158/84 | HR 71 | Ht 68.5 in | Wt 168.9 lb

## 2016-08-09 DIAGNOSIS — I10 Essential (primary) hypertension: Secondary | ICD-10-CM

## 2016-08-09 DIAGNOSIS — E785 Hyperlipidemia, unspecified: Secondary | ICD-10-CM | POA: Diagnosis not present

## 2016-08-09 DIAGNOSIS — Z720 Tobacco use: Secondary | ICD-10-CM

## 2016-08-09 DIAGNOSIS — F17200 Nicotine dependence, unspecified, uncomplicated: Secondary | ICD-10-CM

## 2016-08-09 DIAGNOSIS — F17210 Nicotine dependence, cigarettes, uncomplicated: Secondary | ICD-10-CM | POA: Diagnosis not present

## 2016-08-09 DIAGNOSIS — F4323 Adjustment disorder with mixed anxiety and depressed mood: Secondary | ICD-10-CM | POA: Diagnosis not present

## 2016-08-09 DIAGNOSIS — R748 Abnormal levels of other serum enzymes: Secondary | ICD-10-CM | POA: Diagnosis not present

## 2016-08-09 DIAGNOSIS — F1010 Alcohol abuse, uncomplicated: Secondary | ICD-10-CM

## 2016-08-09 DIAGNOSIS — E0800 Diabetes mellitus due to underlying condition with hyperosmolarity without nonketotic hyperglycemic-hyperosmolar coma (NKHHC): Secondary | ICD-10-CM

## 2016-08-09 MED ORDER — BUPROPION HCL ER (XL) 150 MG PO TB24: 150.0000 mg | ORAL_TABLET | Freq: Every day | ORAL | 1 refills | Status: AC

## 2016-08-09 MED ORDER — METFORMIN HCL ER 500 MG PO TB24: 500.0000 mg | ORAL_TABLET | Freq: Every day | ORAL | 1 refills | Status: DC

## 2016-08-09 MED ORDER — AMLODIPINE BESYLATE 5 MG PO TABS: 5.0000 mg | ORAL_TABLET | Freq: Every day | ORAL | 1 refills | Status: DC

## 2016-08-09 MED ORDER — ATORVASTATIN CALCIUM 20 MG PO TABS: 20.0000 mg | ORAL_TABLET | Freq: Every day | ORAL | 1 refills | Status: DC

## 2016-08-09 NOTE — Patient Instructions (Addendum)
Start taking your metformin every morning. Take that for a week and as long as you're doing well start your amlodipine for your blood pressure. Then take that for 1 week and if you're tolerating well and feel good, then start Wellbutrin- if you want you can use a quit date of 2 weeks after starting the Wellbutrin because this helps with any addiction in addition to mood.  Then after taking that for one week, start atorvastatin for cholesterol.      Diabetes and Exercise Exercising regularly is important. It is not just about losing weight. It has many health benefits, such as:  Improving your overall fitness, flexibility, and endurance.  Increasing your bone density.  Helping with weight control.  Decreasing your body fat.  Increasing your muscle strength.  Reducing stress and tension.  Improving your overall health. People with diabetes who exercise gain additional benefits because exercise:  Reduces appetite.  Improves the body's use of blood sugar (glucose).  Helps lower or control blood glucose.  Decreases blood pressure.  Helps control blood lipids (such as cholesterol and triglycerides).  Improves the body's use of the hormone insulin by:  Increasing the body's insulin sensitivity.  Reducing the body's insulin needs.  Decreases the risk for heart disease because exercising:  Lowers cholesterol and triglycerides levels.  Increases the levels of good cholesterol (such as high-density lipoproteins [HDL]) in the body.  Lowers blood glucose levels. YOUR ACTIVITY PLAN  Choose an activity that you enjoy, and set realistic goals. To exercise safely, you should begin practicing any new physical activity slowly, and gradually increase the intensity of the exercise over time. Your health care provider or diabetes educator can help create an activity plan that works for you. General recommendations include:  Encouraging children to engage in at least 60 minutes of physical  activity each day.  Stretching and performing strength training exercises, such as yoga or weight lifting, at least 2 times per week.  Performing a total of at least 150 minutes of moderate-intensity exercise each week, such as brisk walking or water aerobics.  Exercising at least 3 days per week, making sure you allow no more than 2 consecutive days to pass without exercising.  Avoiding long periods of inactivity (90 minutes or more). When you have to spend an extended period of time sitting down, take frequent breaks to walk or stretch. RECOMMENDATIONS FOR EXERCISING WITH TYPE 1 OR TYPE 2 DIABETES   Check your blood glucose before exercising. If blood glucose levels are greater than 240 mg/dL, check for urine ketones. Do not exercise if ketones are present.  Avoid injecting insulin into areas of the body that are going to be exercised. For example, avoid injecting insulin into:  The arms when playing tennis.  The legs when jogging.  Keep a record of:  Food intake before and after you exercise.  Expected peak times of insulin action.  Blood glucose levels before and after you exercise.  The type and amount of exercise you have done.  Review your records with your health care provider. Your health care provider will help you to develop guidelines for adjusting food intake and insulin amounts before and after exercising.  If you take insulin or oral hypoglycemic agents, watch for signs and symptoms of hypoglycemia. They include:  Dizziness.  Shaking.  Sweating.  Chills.  Confusion.  Drink plenty of water while you exercise to prevent dehydration or heat stroke. Body water is lost during exercise and must be replaced.  Talk  to your health care provider before starting an exercise program to make sure it is safe for you. Remember, almost any type of activity is better than none.   This information is not intended to replace advice given to you by your health care provider.  Make sure you discuss any questions you have with your health care provider.   Document Released: 01/27/2004 Document Revised: 03/23/2015 Document Reviewed: 04/15/2013 Elsevier Interactive Patient Education 2016 [employer].   Smoking Cessation, Tips for Success If you are ready to quit smoking, congratulations! You have chosen to help yourself be healthier. Cigarettes bring nicotine, tar, carbon monoxide, and other irritants into your body. Your lungs, heart, and blood vessels will be able to work better without these poisons. There are many different ways to quit smoking. Nicotine gum, nicotine patches, a nicotine inhaler, or nicotine nasal spray can help with physical craving. Hypnosis, support groups, and medicines help break the habit of smoking. WHAT THINGS CAN I DO TO MAKE QUITTING EASIER?  Here are some tips to help you quit for good:  Pick a date when you will quit smoking completely. Tell all of your friends and family about your plan to quit on that date.  Do not try to slowly cut down on the number of cigarettes you are smoking. Pick a quit date and quit smoking completely starting on that day.  Throw away all cigarettes.   Clean and remove all ashtrays from your home, work, and car.  On a card, write down your reasons for quitting. Carry the card with you and read it when you get the urge to smoke.  Cleanse your body of nicotine. Drink enough water and fluids to keep your urine clear or pale yellow. Do this after quitting to flush the nicotine from your body.  Learn to predict your moods. Do not let a bad situation be your excuse to have a cigarette. Some situations in your life might tempt you into wanting a cigarette.  Never have "just one" cigarette. It leads to wanting another and another. Remind yourself of your decision to quit.  Change habits associated with smoking. If you smoked while driving or when feeling stressed, try other activities to replace smoking. Stand  up when drinking your coffee. Brush your teeth after eating. Sit in a different chair when you read the paper. Avoid alcohol while trying to quit, and try to drink fewer caffeinated beverages. Alcohol and caffeine may urge you to smoke.  Avoid foods and drinks that can trigger a desire to smoke, such as sugary or spicy foods and alcohol.  Ask people who smoke not to smoke around you.  Have something planned to do right after eating or having a cup of coffee. For example, plan to take a walk or exercise.  Try a relaxation exercise to calm you down and decrease your stress. Remember, you may be tense and nervous for the first 2 weeks after you quit, but this will pass.  Find new activities to keep your hands busy. Play with a pen, coin, or rubber band. Doodle or draw things on paper.  Brush your teeth right after eating. This will help cut down on the craving for the taste of tobacco after meals. You can also try mouthwash.   Use oral substitutes in place of cigarettes. Try using lemon drops, carrots, cinnamon sticks, or chewing gum. Keep them handy so they are available when you have the urge to smoke.  When you have the urge to  smoke, try deep breathing.  Designate your home as a nonsmoking area.  If you are a heavy smoker, ask your health care provider about a prescription for nicotine chewing gum. It can ease your withdrawal from nicotine.  Reward yourself. Set aside the cigarette money you save and buy yourself something nice.  Look for support from others. Join a support group or smoking cessation program. Ask someone at home or at work to help you with your plan to quit smoking.  Always ask yourself, "Do I need this cigarette or is this just a reflex?" Tell yourself, "Today, I choose not to smoke," or "I do not want to smoke." You are reminding yourself of your decision to quit.  Do not replace cigarette smoking with electronic cigarettes (commonly called e-cigarettes). The safety  of e-cigarettes is unknown, and some may contain harmful chemicals.  If you relapse, do not give up! Plan ahead and think about what you will do the next time you get the urge to smoke. HOW WILL I FEEL WHEN I QUIT SMOKING? You may have symptoms of withdrawal because your body is used to nicotine (the addictive substance in cigarettes). You may crave cigarettes, be irritable, feel very hungry, cough often, get headaches, or have difficulty concentrating. The withdrawal symptoms are only temporary. They are strongest when you first quit but will go away within 10-14 days. When withdrawal symptoms occur, stay in control. Think about your reasons for quitting. Remind yourself that these are signs that your body is healing and getting used to being without cigarettes. Remember that withdrawal symptoms are easier to treat than the major diseases that smoking can cause.  Even after the withdrawal is over, expect periodic urges to smoke. However, these cravings are generally short lived and will go away whether you smoke or not. Do not smoke! WHAT RESOURCES ARE AVAILABLE TO HELP ME QUIT SMOKING? Your health care provider can direct you to community resources or hospitals for support, which may include:  Group support.  Education.  Hypnosis.  Therapy.   This information is not intended to replace advice given to you by your health care provider. Make sure you discuss any questions you have with your health care provider.   Document Released: 08/04/2004 Document Revised: 11/27/2014 Document Reviewed: 04/24/2013 Elsevier Interactive Patient Education 2016 [place] Diet  Why follow it? Research shows. . Those who follow the Mediterranean diet have a reduced risk of heart disease  . The diet is associated with a reduced incidence of Parkinson's and Alzheimer's diseases . People following the diet may have longer life expectancies and lower rates of chronic diseases  . The  Dietary Guidelines for Americans recommends the Mediterranean diet as an eating plan to promote health and prevent disease  What Is the Mediterranean Diet?  . Healthy eating plan based on typical foods and recipes of Mediterranean-style cooking . The diet is primarily a plant based diet; these foods should make up a majority of meals   Starches - Plant based foods should make up a majority of meals - They are an important sources of vitamins, minerals, energy, antioxidants, and fiber - Choose whole grains, foods high in fiber and minimally processed items  - Typical grain sources include wheat, oats, barley, corn, brown rice, bulgar, farro, millet, polenta, couscous  - Various types of beans include chickpeas, lentils, fava beans, black beans, white beans   Fruits  Veggies - Large quantities of antioxidant rich fruits &  veggies; 6 or more servings  - Vegetables can be eaten raw or lightly drizzled with oil and cooked  - Vegetables common to the traditional Mediterranean Diet include: artichokes, arugula, beets, broccoli, brussel sprouts, cabbage, carrots, celery, collard greens, cucumbers, eggplant, kale, leeks, lemons, lettuce, mushrooms, okra, onions, peas, peppers, potatoes, pumpkin, radishes, rutabaga, shallots, spinach, sweet potatoes, turnips, zucchini - Fruits common to the Mediterranean Diet include: apples, apricots, avocados, cherries, clementines, dates, figs, grapefruits, grapes, melons, nectarines, oranges, peaches, pears, pomegranates, strawberries, tangerines  Fats - Replace butter and margarine with healthy oils, such as olive oil, canola oil, and tahini  - Limit nuts to no more than a handful a day  - Nuts include walnuts, almonds, pecans, pistachios, pine nuts  - Limit or avoid candied, honey roasted or heavily salted nuts - Olives are central to the [employer] - can be eaten whole or used in a variety of dishes   Meats Protein - Limiting red meat: no more than a few  times a month - When eating red meat: choose lean cuts and keep the portion to the size of deck of cards - Eggs: approx. 0 to 4 times a week  - Fish and lean poultry: at least 2 a week  - Healthy protein sources include, chicken, [place], lean beef, lamb - Increase intake of seafood such as tuna, salmon, trout, mackerel, shrimp, scallops - Avoid or limit high fat processed meats such as sausage and bacon  Dairy - Include moderate amounts of low fat dairy products  - Focus on healthy dairy such as fat free yogurt, skim milk, low or reduced fat cheese - Limit dairy products higher in fat such as whole or 2% milk, cheese, ice cream  Alcohol - Moderate amounts of red wine is ok  - No more than 5 oz daily for women (all ages) and men older than age 49  - No more than 10 oz of wine daily for men younger than 22  Other - Limit sweets and other desserts  - Use herbs and spices instead of salt to flavor foods  - Herbs and spices common to the traditional Mediterranean Diet include: basil, bay leaves, chives, cloves, cumin, fennel, garlic, lavender, marjoram, mint, oregano, parsley, pepper, rosemary, sage, savory, sumac, tarragon, thyme   It's not just a diet, it's a lifestyle:  . The Mediterranean diet includes lifestyle factors typical of those in the region  . Foods, drinks and meals are best eaten with others and savored . Daily physical activity is important for overall good health . This could be strenuous exercise like running and aerobics . This could also be more leisurely activities such as walking, housework, yard-work, or taking the stairs . Moderation is the key; a balanced and healthy diet accommodates most foods and drinks . Consider portion sizes and frequency of consumption of certain foods   Meal Ideas & Options:  . Breakfast:  o Whole wheat toast or whole wheat English muffins with peanut butter & hard boiled egg o Steel cut oats topped with apples & cinnamon and skim milk   o Fresh fruit: banana, strawberries, melon, berries, peaches  o Smoothies: strawberries, bananas, greek yogurt, peanut butter o Low fat greek yogurt with blueberries and granola  o Egg white omelet with spinach and mushrooms o Breakfast couscous: whole wheat couscous, apricots, skim milk, cranberries  . Sandwiches:  o Hummus and grilled vegetables (peppers, zucchini, squash) on whole wheat bread   o Grilled chicken on whole wheat  pita with lettuce, tomatoes, cucumbers or tzatziki  o Tuna salad on whole wheat bread: tuna salad made with greek yogurt, olives, red peppers, capers, green onions o Garlic rosemary lamb pita: lamb sauted with garlic, rosemary, salt & pepper; add lettuce, cucumber, greek yogurt to pita - flavor with lemon juice and black pepper  . Seafood:  o Mediterranean grilled salmon, seasoned with garlic, basil, parsley, lemon juice and black pepper o Shrimp, lemon, and spinach whole-grain pasta salad made with low fat greek yogurt  o Seared scallops with lemon orzo  o Seared tuna steaks seasoned salt, pepper, coriander topped with tomato mixture of olives, tomatoes, olive oil, minced garlic, parsley, green onions and cappers  . Meats:  o Herbed greek chicken salad with kalamata olives, cucumber, feta  o Red bell peppers stuffed with spinach, bulgur, lean ground beef (or lentils) & topped with feta   o Kebabs: skewers of chicken, tomatoes, onions, zucchini, squash  o [place] burgers: made with red onions, mint, dill, lemon juice, feta cheese topped with roasted red peppers . Vegetarian o Cucumber salad: cucumbers, artichoke hearts, celery, red onion, feta cheese, tossed in olive oil & lemon juice  o Hummus and whole grain pita points with a greek salad (lettuce, tomato, feta, olives, cucumbers, red onion) o Lentil soup with celery, carrots made with vegetable broth, garlic, salt and pepper  o Tabouli salad: parsley, bulgur, mint, scallions, cucumbers, tomato, radishes, lemon  juice, olive oil, salt and pepper.

## 2016-08-09 NOTE — Progress Notes (Signed)
Impression and Recommendations:    1. Diabetes mellitus due to underlying condition with hyperosmolarity without coma, without long-term current use of insulin (HCC)   - A1c 6.2 today. Refilled his metformin. Continue to monitor sugars. Aware he needs eye and foot exams yearly.  Patient told me he will call for them   2. HLD (hyperlipidemia)   - LDL not at goal. Statin started after risks and benefits were discussed. Prudent diet and lifestyle changes discussed in detail. Recheck LFTs in 4-6 weeks    3. Elevated liver enzymes due to heavy ETOH use  - Explained significance of slightly elevated liver enzyme. Highly encouraged to abstain. A meetings strongly encouraged    4. Adjustment disorder with mixed anxiety and depressed mood  - Wellbutrin started. Discussed the patient is also help with mood as well as his desire to want to quit smoking. We will discuss a quit date and office visit if he is still doing well on medicines     5. Essential hypertension   - Blood pressure not at goal.  We'll start amlodipine. Risks benefits medicines discussed.  Try to monitor blood pressure at home if possible    6. Alcohol abuse   - Abstain,   AA meetings    7. Current smoker- >er 50 pack yrs   - Handouts and supportive literature provided after our educational session regarding smoking sensation which was approximately 4 minutes    Pt was in the office today for 40+ minutes, with over 50% time spent in face to face counseling of various medical concerns and in coordination of care Patient's Medications  New Prescriptions   AMLODIPINE (NORVASC) 5 MG TABLET    Take 1 tablet (5 mg total) by mouth daily. 1 tab every AM for Blood Pressure   ATORVASTATIN (LIPITOR) 20 MG TABLET    Take 1 tablet (20 mg total) by mouth at bedtime.   BUPROPION (WELLBUTRIN XL) 150 MG 24 HR TABLET    Take 1 tablet (150 mg total) by mouth daily. For mood/quitting smok/drink   METFORMIN (GLUCOPHAGE XR) 500 MG 24  HR TABLET    Take 1 tablet (500 mg total) by mouth daily with breakfast.  Previous Medications   ASPIRIN 325 MG TABLET    Take 81 mg by mouth daily.   Modified Medications   No medications on file  Discontinued Medications   CITALOPRAM (CELEXA) 40 MG TABLET    Take 1 tablet (40 mg total) by mouth daily.   METFORMIN (GLUCOPHAGE) 500 MG TABLET    Take 1 tablet (500 mg total) by mouth 2 (two) times daily with a meal.    Meds ordered this encounter  Medications  . metFORMIN (GLUCOPHAGE XR) 500 MG 24 hr tablet    Sig: Take 1 tablet (500 mg total) by mouth daily with breakfast.    Dispense:  90 tablet    Refill:  1  . amLODipine (NORVASC) 5 MG tablet    Sig: Take 1 tablet (5 mg total) by mouth daily. 1 tab every AM for Blood Pressure    Dispense:  90 tablet    Refill:  1  . atorvastatin (LIPITOR) 20 MG tablet    Sig: Take 1 tablet (20 mg total) by mouth at bedtime.    Dispense:  90 tablet    Refill:  1  . buPROPion (WELLBUTRIN XL) 150 MG 24 hr tablet    Sig: Take 1 tablet (150 mg total) by mouth daily. For  mood/quitting smok/drink    Dispense:  90 tablet    Refill:  1   Medications Discontinued During This Encounter  Medication Reason  . citalopram (CELEXA) 40 MG tablet Therapy completed  . metFORMIN (GLUCOPHAGE) 500 MG tablet Change in therapy  - Patient was not taking Celexa for many months    The patient was counseled, risk factors were discussed, anticipatory guidance given.  Gross side effects, risk and benefits, and alternatives of medications and treatment plan in general discussed with patient.  Patient is aware that all medications have potential side effects and we are unable to predict every side effect or drug-drug interaction that may occur.   Patient will call with any questions prior to using medication if they have concerns.  Expresses verbal understanding and consents to current therapy and treatment regimen.  No barriers to understanding were identified.  Red flag  symptoms and signs discussed in detail.  Patient expressed understanding regarding what to do in case of emergency\urgent symptoms  Return in about 4 weeks (around 09/06/2016) for Follow-up of current medical issues- started multiple new medications- check LFT's.  Orders Placed This Encounter  Procedures  . ALT  . AST   Please see AVS handed out to patient at the end of our visit for further patient instructions/ counseling done pertaining to today's office visit.    Note: This document was prepared using Dragon voice recognition software and may include unintentional dictation errors.   --------------------------------------------------------------------------------------------------------------------------------------------------------------------------------------------------------------------------------------------    Subjective:    CC:  Chief Complaint  Patient presents with  . Results    HPI: Luis Brewer is a 70 y.o. male who presents to [facility] today for issues as discussed below.  - Patient is here today to review all his recent fasting labs. He has no complaints.  DM:   Since last OV- has been checking it regularly running FBS- 100-120.  Tol med well, needs RF  Bp- not at goal.  No sx  Depressed mood:  Patient states it's been difficult living with his daughter. He doesn't have home\his own place. He doesn't have the year and back in [place], he couldn't walk everywhere and he was very social but here he is not. He denies any suicidal or homicidal ideations. He denies any hallucinations. He does not want to hurt himself or anyone else. Just feels down.   Also with his extensive smoking history, patient is interested in quitting, but not sure if he is emotionally very.  Lives with daughter still;  still drinking a lot.  Smoking about 1/2-1 ppd. Tried nicotine patches--> weird dreams  Wt Readings from Last 3 Encounters:    08/09/16 168 lb 14.4 oz (76.6 kg)  06/20/16 156 lb 4.8 oz (70.9 kg)  06/17/13 170 lb (77.1 kg)   BP Readings from Last 3 Encounters:  08/09/16 (!) 158/84  06/20/16 (!) 142/89  06/17/13 119/74   Pulse Readings from Last 3 Encounters:  08/09/16 71  06/20/16 81  06/17/13 83   BMI Readings from Last 3 Encounters:  08/09/16 25.31 kg/m  06/20/16 23.42 kg/m  06/17/13 25.10 kg/m   Recent Results (from the past 2160 hour(s))  HIV antibody (with reflex)     Status: None   Collection Time: 06/22/16 10:31 AM  Result Value Ref Range   HIV 1&2 Ab, 4th Generation NONREACTIVE NONREACTIVE    Comment:   HIV-1 antigen and HIV-1/HIV-2 antibodies were not detected.  There is no laboratory evidence of HIV  infection.   HIV-1/2 Antibody Diff        Not indicated. HIV-1 RNA, Qual TMA          Not indicated.     PLEASE NOTE: This information has been disclosed to you from records whose confidentiality may be protected by state law. If your state requires such protection, then the state law prohibits you from making any further disclosure of the information without the specific written consent of the person to whom it pertains, or as otherwise permitted by law. A general authorization for the release of medical or other information is NOT sufficient for this purpose.   The performance of this assay has not been clinically validated in patients less than 36 years old.   For additional information please refer to http://education.[facility].com/faq/FAQ106.  (This link is being provided for informational/educational purposes only.)     Phosphorus     Status: None   Collection Time: 06/22/16 10:31 AM  Result Value Ref Range   Phosphorus 3.7 2.1 - 4.3 mg/dL  Magnesium     Status: None   Collection Time: 06/22/16 10:31 AM  Result Value Ref Range   Magnesium 1.7 1.5 - 2.5 mg/dL  Lipid panel     Status: None   Collection Time: 06/22/16 10:31 AM  Result Value Ref Range   Cholesterol  179 125 - 200 mg/dL   Triglycerides 55 <150 mg/dL   HDL 53 >=40 mg/dL   Total CHOL/HDL Ratio 3.4 <=5.0 Ratio   VLDL 11 <30 mg/dL   LDL Cholesterol 115 <130 mg/dL    Comment:   Total Cholesterol/HDL Ratio:CHD Risk                        Coronary Heart Disease Risk Table                                        Men       Women          1/2 Average Risk              3.4        3.3              Average Risk              5.0        4.4           2X Average Risk              9.6        7.1           3X Average Risk             23.4       11.0 Use the calculated Patient Ratio above and the CHD Risk table  to determine the patient's CHD Risk.   Folate     Status: None   Collection Time: 06/22/16 10:31 AM  Result Value Ref Range   Folate 16.9 >5.4 ng/mL    Comment: Reference Range >17 years:   Low: <3.4 ng/mL              Borderline: 3.4-5.4 ng/mL              Normal: >5.4 ng/mL     TSH     Status: None   Collection Time: 06/22/16 10:31 AM  Result Value Ref Range   TSH 0.96 0.40 - 4.50 mIU/L  CBC w/Diff     Status: None   Collection Time: 06/22/16 10:31 AM  Result Value Ref Range   WBC 5.6 3.8 - 10.8 K/uL   RBC 4.47 4.20 - 5.80 MIL/uL   Hemoglobin 14.6 13.2 - 17.1 g/dL   HCT 43.3 38.5 - 50.0 %   MCV 96.9 80.0 - 100.0 fL   MCH 32.7 27.0 - 33.0 pg   MCHC 33.7 32.0 - 36.0 g/dL   RDW 12.7 11.0 - 15.0 %   Platelets 292 140 - 400 K/uL   MPV 9.3 7.5 - 12.5 fL   Neutro Abs 2,688 1,500 - 7,800 cells/uL   Lymphs Abs 2,072 850 - 3,900 cells/uL   Monocytes Absolute 616 200 - 950 cells/uL   Eosinophils Absolute 168 15 - 500 cells/uL   Basophils Absolute 56 0 - 200 cells/uL   Neutrophils Relative % 48 %   Lymphocytes Relative 37 %   Monocytes Relative 11 %   Eosinophils Relative 3 %   Basophils Relative 1 %   Smear Review Criteria for review not met     Comment: ** Please note change in unit of measure and reference range(s). **  Comp Met (CMET)     Status: Abnormal   Collection Time:  06/22/16 10:31 AM  Result Value Ref Range   Sodium 133 (L) 135 - 146 mmol/L   Potassium 5.2 3.5 - 5.3 mmol/L   Chloride 98 98 - 110 mmol/L   CO2 21 20 - 31 mmol/L   Glucose, Bld 116 (H) 65 - 99 mg/dL   BUN 7 7 - 25 mg/dL   Creat 0.78 0.70 - 1.25 mg/dL    Comment:   For patients > or = 68 years of age: The upper reference limit for Creatinine is approximately 13% higher for people identified as African-American.      Total Bilirubin 0.6 0.2 - 1.2 mg/dL   Alkaline Phosphatase 55 40 - 115 U/L   AST 53 (H) 10 - 35 U/L   ALT 46 9 - 46 U/L   Total Protein 7.7 6.1 - 8.1 g/dL   Albumin 4.1 3.6 - 5.1 g/dL   Calcium 9.8 8.6 - 10.3 mg/dL  Sed Rate (ESR)     Status: None   Collection Time: 06/22/16 10:31 AM  Result Value Ref Range   Sed Rate 20 0 - 20 mm/hr  RPR     Status: None   Collection Time: 06/22/16 10:31 AM  Result Value Ref Range   RPR Ser Ql NON REAC NON REAC  Urine Microalbumin w/creat. ratio     Status: None   Collection Time: 06/22/16 11:07 AM  Result Value Ref Range   Creatinine, Urine 130 20 - 370 mg/dL   Microalb, Ur 3.0 Not estab mg/dL   Microalb Creat Ratio 23 <30 mcg/mg creat    Comment: The ADA has defined abnormalities in albumin excretion as follows:           Category           Result                            (mcg/mg creatinine)                 Normal:    <30       Microalbuminuria:    30 - 299  Clinical albuminuria:    > or = 300   The ADA recommends that at least two of three specimens collected within a 3 - 6 month period be abnormal before considering a patient to be within a diagnostic category.     Vitamin B12     Status: None   Collection Time: 06/22/16 11:07 AM  Result Value Ref Range   Vitamin B-12 500 200 - 1,100 pg/mL  POCT glycosylated hemoglobin (Hb A1C)     Status: Abnormal   Collection Time: 06/22/16 11:10 AM  Result Value Ref Range   Hemoglobin A1C 6.2     Patient Active Problem List   Diagnosis Date Noted  . HLD  (hyperlipidemia) 06/20/2016    Priority: High  . Alcohol abuse 06/15/2011    Priority: High  . Diabetes mellitus ([place]) 10/05/2009    Priority: High  . HTN (hypertension) 10/05/2009    Priority: High  . Current smoker- >er 50 pack yrs 06/24/2016    Priority: Medium  . Memory difficulties 06/24/2016    Priority: Medium  . GERD 10/05/2009    Priority: Low  . Elevated liver enzymes due to heavy ETOH use 08/09/2016  . Routine general medical examination at a health care facility 06/24/2016  . Encounter for screening for diseases of the blood and blood-forming organs and certain disorders involving the immune mechanism 06/24/2016  . Screening examination for STD (sexually transmitted disease) 06/24/2016  . 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 Cancer ([place]) 08/30/2012  . Increased prostate specific antigen (PSA) velocity 06/16/2011  . Fatigue 06/15/2011  . Bladder neck obstruction 06/15/2011  . Preventative health care 06/13/2011  . Adjustment disorder with mixed anxiety and depressed mood 10/05/2009    Past Medical history, Surgical history, Family history, Social history, Allergies and Medications have been entered into the medical record, reviewed and changed as needed.   Allergies:  Allergies  Allergen Reactions  . Lisinopril     REACTION: throat swelling    Review of Systems: No fever/ chills, night sweats, no unintended weight loss, No HA, Vis Changes, orthopnea, PND, wh,  chest pain, or increased shortness of breath.  No N/V/D.   Pertinent positives and negatives noted in HPI above as well   Objective:   Blood pressure (!) 158/84, pulse 71, height 5' 8.5" (1.74 m), weight 168 lb 14.4 oz (76.6 kg). Body mass index is 25.31 kg/m. General: Well Developed, well nourished, appropriate for stated age.  Neuro: Alert and oriented x3, extra-ocular muscles intact, sensation grossly intact.  HEENT: Normocephalic, atraumatic, neck supple, no JVD no carotid bruit    Skin: Warm and dry, no gross rash. Cardiac: RRR, S1 S2,  no murmurs rubs or gallops.  Respiratory: ECTA B/L,  prolonged inhalation and exhalation phase.  No wheeze, Not using accessory muscles, speaking in full sentences-unlabored. Vascular:  No gross lower ext edema, cap RF less 2 sec. Psych: No HI/SI, judgement and insight good, depressed mood.

## 2016-08-09 NOTE — Assessment & Plan Note (Addendum)
LDL not at goal; statin started

## 2017-01-01 ENCOUNTER — Emergency Department (HOSPITAL_COMMUNITY)
Admission: EM | Admit: 2017-01-01 | Discharge: 2017-01-02 | Disposition: A | Discharge status: Home / Self Care | Payer: Medicare Other | Attending: Emergency Medicine | Admitting: Emergency Medicine

## 2017-01-01 ENCOUNTER — Encounter (HOSPITAL_COMMUNITY): Payer: Self-pay | Admitting: Emergency Medicine

## 2017-01-01 DIAGNOSIS — I82442 Acute embolism and thrombosis of left tibial vein: Secondary | ICD-10-CM | POA: Diagnosis not present

## 2017-01-01 DIAGNOSIS — E119 Type 2 diabetes mellitus without complications: Secondary | ICD-10-CM | POA: Diagnosis not present

## 2017-01-01 DIAGNOSIS — Z7984 Long term (current) use of oral hypoglycemic drugs: Secondary | ICD-10-CM | POA: Diagnosis not present

## 2017-01-01 DIAGNOSIS — Z7982 Long term (current) use of aspirin: Secondary | ICD-10-CM | POA: Insufficient documentation

## 2017-01-01 DIAGNOSIS — Z79899 Other long term (current) drug therapy: Secondary | ICD-10-CM | POA: Insufficient documentation

## 2017-01-01 DIAGNOSIS — I824Y2 Acute embolism and thrombosis of unspecified deep veins of left proximal lower extremity: Secondary | ICD-10-CM | POA: Insufficient documentation

## 2017-01-01 DIAGNOSIS — I10 Essential (primary) hypertension: Secondary | ICD-10-CM | POA: Diagnosis not present

## 2017-01-01 DIAGNOSIS — F17200 Nicotine dependence, unspecified, uncomplicated: Secondary | ICD-10-CM | POA: Diagnosis not present

## 2017-01-01 DIAGNOSIS — M7989 Other specified soft tissue disorders: Secondary | ICD-10-CM | POA: Diagnosis not present

## 2017-01-01 MED ORDER — ENOXAPARIN SODIUM 80 MG/0.8ML ~~LOC~~ SOLN
80.0000 mg | Freq: Once | SUBCUTANEOUS | Status: AC
  Administered 2017-01-02: 80 mg via SUBCUTANEOUS
  Filled 2017-01-01: qty 0.8

## 2017-01-01 NOTE — ED Notes (Signed)
Patient asking EMT about delays in having US done.  This RN spoke to patient and daughter about Vascular lab delays and need to be assessed by MD for other concerns.  Reassured patient and will try to place in next appropriate bed.

## 2017-01-01 NOTE — ED Notes (Signed)
Patient asking about delays in [place].  This RN explained [place] process and delays, apologized for wait, and e

## 2017-01-01 NOTE — ED Notes (Signed)
Called Vascular Lab , stated, they have left for the day.
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
## 2017-01-01 NOTE — ED Triage Notes (Signed)
Pt. Stated, My left leg has been hurting and swelling for the last 2 days. Daughter stated, about a year ago he had a cancerous tumor on his left leg. . Pt. Has no injury.

## 2017-01-02 ENCOUNTER — Emergency Department (HOSPITAL_COMMUNITY): Payer: Medicare Other

## 2017-01-02 ENCOUNTER — Ambulatory Visit (HOSPITAL_COMMUNITY): Admission: RE | Admit: 2017-01-02 | Payer: Medicare Other | Source: Ambulatory Visit

## 2017-01-02 ENCOUNTER — Ambulatory Visit (HOSPITAL_BASED_OUTPATIENT_CLINIC_OR_DEPARTMENT_OTHER)
Admission: RE | Admit: 2017-01-02 | Discharge: 2017-01-02 | Disposition: A | Discharge status: Home / Self Care | Payer: Medicare Other | Source: Ambulatory Visit | Attending: Emergency Medicine | Admitting: Emergency Medicine

## 2017-01-02 DIAGNOSIS — M7989 Other specified soft tissue disorders: Secondary | ICD-10-CM

## 2017-01-02 DIAGNOSIS — M79605 Pain in left leg: Secondary | ICD-10-CM | POA: Insufficient documentation

## 2017-01-02 DIAGNOSIS — Z859 Personal history of malignant neoplasm, unspecified: Secondary | ICD-10-CM | POA: Insufficient documentation

## 2017-01-02 NOTE — Progress Notes (Signed)
VASCULAR LAB PRELIMINARY  PRELIMINARY  PRELIMINARY  PRELIMINARY  Left lower extremity venous duplex completed.    Preliminary report:  Left:  No evidence of DVT, superficial thrombosis, or Baker's cyst.  Deane Melick, RVS 01/03/2016 2:50 PM

## 2019-06-25 ENCOUNTER — Emergency Department (HOSPITAL_COMMUNITY): Payer: Medicare Other

## 2019-06-25 ENCOUNTER — Other Ambulatory Visit: Payer: Self-pay

## 2019-06-25 ENCOUNTER — Emergency Department (HOSPITAL_COMMUNITY)
Admission: EM | Admit: 2019-06-25 | Discharge: 2019-06-25 | Disposition: A | Discharge status: Home / Self Care | Payer: Medicare Other | Attending: Emergency Medicine | Admitting: Emergency Medicine

## 2019-06-25 ENCOUNTER — Emergency Department (HOSPITAL_BASED_OUTPATIENT_CLINIC_OR_DEPARTMENT_OTHER)
Admit: 2019-06-25 | Discharge: 2019-06-25 | Disposition: A | Discharge status: Home / Self Care | Payer: Medicare Other | Attending: Emergency Medicine | Admitting: Emergency Medicine

## 2019-06-25 DIAGNOSIS — F17210 Nicotine dependence, cigarettes, uncomplicated: Secondary | ICD-10-CM | POA: Diagnosis not present

## 2019-06-25 DIAGNOSIS — E119 Type 2 diabetes mellitus without complications: Secondary | ICD-10-CM | POA: Insufficient documentation

## 2019-06-25 DIAGNOSIS — S0990XA Unspecified injury of head, initial encounter: Secondary | ICD-10-CM

## 2019-06-25 DIAGNOSIS — W19XXXA Unspecified fall, initial encounter: Secondary | ICD-10-CM

## 2019-06-25 DIAGNOSIS — Y9389 Activity, other specified: Secondary | ICD-10-CM | POA: Diagnosis not present

## 2019-06-25 DIAGNOSIS — Z79899 Other long term (current) drug therapy: Secondary | ICD-10-CM | POA: Insufficient documentation

## 2019-06-25 DIAGNOSIS — F10920 Alcohol use, unspecified with intoxication, uncomplicated: Secondary | ICD-10-CM

## 2019-06-25 DIAGNOSIS — Z7982 Long term (current) use of aspirin: Secondary | ICD-10-CM | POA: Diagnosis not present

## 2019-06-25 DIAGNOSIS — M79609 Pain in unspecified limb: Secondary | ICD-10-CM | POA: Diagnosis not present

## 2019-06-25 DIAGNOSIS — Y999 Unspecified external cause status: Secondary | ICD-10-CM | POA: Insufficient documentation

## 2019-06-25 DIAGNOSIS — I10 Essential (primary) hypertension: Secondary | ICD-10-CM | POA: Diagnosis not present

## 2019-06-25 DIAGNOSIS — W108XXA Fall (on) (from) other stairs and steps, initial encounter: Secondary | ICD-10-CM | POA: Diagnosis not present

## 2019-06-25 DIAGNOSIS — M7989 Other specified soft tissue disorders: Secondary | ICD-10-CM | POA: Diagnosis not present

## 2019-06-25 DIAGNOSIS — Y92008 Other place in unspecified non-institutional (private) residence as the place of occurrence of the external cause: Secondary | ICD-10-CM | POA: Insufficient documentation

## 2019-06-25 DIAGNOSIS — R2242 Localized swelling, mass and lump, left lower limb: Secondary | ICD-10-CM | POA: Insufficient documentation

## 2019-06-25 LAB — COMPREHENSIVE METABOLIC PANEL
ALT: 40 U/L (ref 0–44)
AST: 75 U/L — ABNORMAL HIGH (ref 15–41)
Albumin: 3.3 g/dL — ABNORMAL LOW (ref 3.5–5.0)
Alkaline Phosphatase: 39 U/L (ref 38–126)
Anion gap: 11 (ref 5–15)
BUN: 8 mg/dL (ref 8–23)
CO2: 22 mmol/L (ref 22–32)
Calcium: 8.4 mg/dL — ABNORMAL LOW (ref 8.9–10.3)
Chloride: 105 mmol/L (ref 98–111)
Creatinine, Ser: 0.95 mg/dL (ref 0.61–1.24)
GFR calc Af Amer: >60 mL/min (ref >60)
GFR calc non Af Amer: >60 mL/min (ref >60)
Glucose, Bld: 111 mg/dL — ABNORMAL HIGH (ref 70–99)
Potassium: 3.7 mmol/L (ref 3.5–5.1)
Sodium: 138 mmol/L (ref 135–145)
Total Bilirubin: 0.6 mg/dL (ref 0.3–1.2)
Total Protein: 7 g/dL (ref 6.5–8.1)

## 2019-06-25 LAB — CBC WITH DIFFERENTIAL/PLATELET
Abs Immature Granulocytes: 0.01 10*3/uL (ref 0.00–0.07)
Basophils Absolute: 0.1 10*3/uL (ref 0.0–0.1)
Basophils Relative: 2 %
Eosinophils Absolute: 0.2 10*3/uL (ref 0.0–0.5)
Eosinophils Relative: 5 %
HCT: 38.5 % — ABNORMAL LOW (ref 39.0–52.0)
Hemoglobin: 13.2 g/dL (ref 13.0–17.0)
Immature Granulocytes: 0 %
Lymphocytes Relative: 34 %
Lymphs Abs: 1.2 10*3/uL (ref 0.7–4.0)
MCH: 34.4 pg — ABNORMAL HIGH (ref 26.0–34.0)
MCHC: 34.3 g/dL (ref 30.0–36.0)
MCV: 100.3 fL — ABNORMAL HIGH (ref 80.0–100.0)
Monocytes Absolute: 0.4 10*3/uL (ref 0.1–1.0)
Monocytes Relative: 12 %
Neutro Abs: 1.7 10*3/uL (ref 1.7–7.7)
Neutrophils Relative %: 47 %
Platelets: 232 10*3/uL (ref 150–400)
RBC: 3.84 MIL/uL — ABNORMAL LOW (ref 4.22–5.81)
RDW: 12.3 % (ref 11.5–15.5)
WBC: 3.5 10*3/uL — ABNORMAL LOW (ref 4.0–10.5)
nRBC: 0 % (ref 0.0–0.2)

## 2019-06-25 LAB — ETHANOL: Alcohol, Ethyl (B): 358 mg/dL (ref <10)

## 2019-06-25 LAB — LIPASE, BLOOD: Lipase: 47 U/L (ref 11–51)

## 2019-06-25 NOTE — Discharge Instructions (Signed)
Your imaging today did not show evidence of fracture, dislocation, or other new injury.  The ultrasound of your leg did not show DVT.  Your alcohol level was elevated today but your other labs were overall reassuring.  Given your well appearance, we feel you are safe for discharge home.  Please consider using resources for inpatient or outpatient substance abuse assistance.  If any symptoms change or worsen, please return to the nearest emergency department.

## 2019-06-25 NOTE — Progress Notes (Signed)
Left lower extremity venous duplex completed. Preliminary results in Chart review CV Proc. 06/25/2019 11:45 AM Toma Copier, [place]

## 2019-06-25 NOTE — ED Notes (Signed)
PO challenge given at this time.
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
## 2019-06-25 NOTE — ED Notes (Signed)
Vascular US at bedside.

## 2019-06-25 NOTE — ED Triage Notes (Signed)
Pt presents to the ED following a fall that occurred last evening. Per patient "I fell down drunk last night and hit my head." Pt complaining of left leg pain, and per family at bedside patient was complaining of head pain earlier today. Pt reports he drinks about a fifth of vodka a day. Pt is alert and oriented at present. Left leg does appear more swollen than right, but family reports that was likely swollen before the fall. Hematoma present on patient's forehead.

## 2019-06-25 NOTE — ED Notes (Signed)
Pt tolerated PO challenge well.  EDP notified.  Plan for DC

## 2021-02-23 ENCOUNTER — Other Ambulatory Visit: Payer: Self-pay | Admitting: Student

## 2021-02-23 DIAGNOSIS — Z129 Encounter for screening for malignant neoplasm, site unspecified: Secondary | ICD-10-CM

## 2021-10-27 ENCOUNTER — Emergency Department (HOSPITAL_COMMUNITY)
Admission: EM | Admit: 2021-10-27 | Discharge: 2021-10-28 | Discharge status: Against Medical Advice | Payer: Self-pay | Source: Home / Self Care

## 2021-12-20 ENCOUNTER — Observation Stay (HOSPITAL_COMMUNITY)
Admission: EM | Admit: 2021-12-20 | Discharge: 2021-12-22 | Disposition: A | Discharge status: Home / Self Care | Payer: Medicare (Managed Care) | Attending: Internal Medicine | Admitting: Internal Medicine

## 2021-12-20 ENCOUNTER — Emergency Department (HOSPITAL_COMMUNITY): Payer: Medicare (Managed Care)

## 2021-12-20 DIAGNOSIS — R29701 NIHSS score 1: Secondary | ICD-10-CM | POA: Diagnosis present

## 2021-12-20 DIAGNOSIS — Z20822 Contact with and (suspected) exposure to covid-19: Secondary | ICD-10-CM | POA: Diagnosis present

## 2021-12-20 DIAGNOSIS — Z85828 Personal history of other malignant neoplasm of skin: Secondary | ICD-10-CM | POA: Insufficient documentation

## 2021-12-20 DIAGNOSIS — K2100 Gastro-esophageal reflux disease with esophagitis, without bleeding: Secondary | ICD-10-CM | POA: Insufficient documentation

## 2021-12-20 DIAGNOSIS — R4781 Slurred speech: Secondary | ICD-10-CM | POA: Diagnosis not present

## 2021-12-20 DIAGNOSIS — Z923 Personal history of irradiation: Secondary | ICD-10-CM | POA: Insufficient documentation

## 2021-12-20 DIAGNOSIS — E119 Type 2 diabetes mellitus without complications: Secondary | ICD-10-CM | POA: Diagnosis not present

## 2021-12-20 DIAGNOSIS — Z85831 Personal history of malignant neoplasm of soft tissue: Secondary | ICD-10-CM

## 2021-12-20 DIAGNOSIS — F10129 Alcohol abuse with intoxication, unspecified: Secondary | ICD-10-CM | POA: Diagnosis present

## 2021-12-20 DIAGNOSIS — F1010 Alcohol abuse, uncomplicated: Secondary | ICD-10-CM | POA: Diagnosis present

## 2021-12-20 DIAGNOSIS — I63521 Cerebral infarction due to unspecified occlusion or stenosis of right anterior cerebral artery: Secondary | ICD-10-CM | POA: Diagnosis present

## 2021-12-20 DIAGNOSIS — Z8249 Family history of ischemic heart disease and other diseases of the circulatory system: Secondary | ICD-10-CM

## 2021-12-20 DIAGNOSIS — Z79899 Other long term (current) drug therapy: Secondary | ICD-10-CM | POA: Diagnosis not present

## 2021-12-20 DIAGNOSIS — I10 Essential (primary) hypertension: Secondary | ICD-10-CM | POA: Diagnosis present

## 2021-12-20 DIAGNOSIS — I63511 Cerebral infarction due to unspecified occlusion or stenosis of right middle cerebral artery: Secondary | ICD-10-CM | POA: Diagnosis not present

## 2021-12-20 DIAGNOSIS — I639 Cerebral infarction, unspecified: Secondary | ICD-10-CM

## 2021-12-20 DIAGNOSIS — F17210 Nicotine dependence, cigarettes, uncomplicated: Secondary | ICD-10-CM | POA: Diagnosis present

## 2021-12-20 DIAGNOSIS — Y906 Blood alcohol level of 120-199 mg/100 ml: Secondary | ICD-10-CM | POA: Diagnosis present

## 2021-12-20 DIAGNOSIS — Z811 Family history of alcohol abuse and dependence: Secondary | ICD-10-CM

## 2021-12-20 DIAGNOSIS — I6389 Other cerebral infarction: Principal | ICD-10-CM | POA: Insufficient documentation

## 2021-12-20 DIAGNOSIS — Z1159 Encounter for screening for other viral diseases: Secondary | ICD-10-CM | POA: Diagnosis not present

## 2021-12-20 DIAGNOSIS — Z7984 Long term (current) use of oral hypoglycemic drugs: Secondary | ICD-10-CM | POA: Insufficient documentation

## 2021-12-20 DIAGNOSIS — Z833 Family history of diabetes mellitus: Secondary | ICD-10-CM

## 2021-12-20 DIAGNOSIS — E1169 Type 2 diabetes mellitus with other specified complication: Secondary | ICD-10-CM | POA: Diagnosis present

## 2021-12-20 DIAGNOSIS — M6281 Muscle weakness (generalized): Secondary | ICD-10-CM | POA: Insufficient documentation

## 2021-12-20 DIAGNOSIS — Z1152 Encounter for screening for COVID-19: Secondary | ICD-10-CM | POA: Diagnosis not present

## 2021-12-20 DIAGNOSIS — E782 Mixed hyperlipidemia: Secondary | ICD-10-CM | POA: Diagnosis not present

## 2021-12-20 DIAGNOSIS — R29810 Facial weakness: Secondary | ICD-10-CM | POA: Diagnosis present

## 2021-12-20 DIAGNOSIS — K219 Gastro-esophageal reflux disease without esophagitis: Secondary | ICD-10-CM | POA: Diagnosis present

## 2021-12-20 DIAGNOSIS — Z8042 Family history of malignant neoplasm of prostate: Secondary | ICD-10-CM

## 2021-12-20 DIAGNOSIS — G8194 Hemiplegia, unspecified affecting left nondominant side: Secondary | ICD-10-CM | POA: Diagnosis present

## 2021-12-20 DIAGNOSIS — R531 Weakness: Secondary | ICD-10-CM | POA: Diagnosis present

## 2021-12-20 DIAGNOSIS — I6523 Occlusion and stenosis of bilateral carotid arteries: Secondary | ICD-10-CM | POA: Diagnosis present

## 2021-12-20 LAB — URINALYSIS, ROUTINE W REFLEX MICROSCOPIC
Bilirubin Urine: NEGATIVE
Glucose, UA: NEGATIVE mg/dL
Hgb urine dipstick: NEGATIVE
Ketones, ur: NEGATIVE mg/dL
Leukocytes,Ua: NEGATIVE
Nitrite: NEGATIVE
Protein, ur: NEGATIVE mg/dL
Specific Gravity, Urine: 1.025 (ref 1.005–1.030)
pH: 5.5 (ref 5.0–8.0)

## 2021-12-20 LAB — COMPREHENSIVE METABOLIC PANEL
ALT: 23 U/L (ref 0–44)
AST: 29 U/L (ref 15–41)
Albumin: 3.8 g/dL (ref 3.5–5.0)
Alkaline Phosphatase: 42 U/L (ref 38–126)
Anion gap: 10 (ref 5–15)
BUN: 16 mg/dL (ref 8–23)
CO2: 21 mmol/L — ABNORMAL LOW (ref 22–32)
Calcium: 9.2 mg/dL (ref 8.9–10.3)
Chloride: 100 mmol/L (ref 98–111)
Creatinine, Ser: 1.17 mg/dL (ref 0.61–1.24)
GFR, Estimated: >60 mL/min (ref >60)
Glucose, Bld: 109 mg/dL — ABNORMAL HIGH (ref 70–99)
Potassium: 4.6 mmol/L (ref 3.5–5.1)
Sodium: 131 mmol/L — ABNORMAL LOW (ref 135–145)
Total Bilirubin: 0.3 mg/dL (ref 0.3–1.2)
Total Protein: 7.7 g/dL (ref 6.5–8.1)

## 2021-12-20 LAB — CBC
HCT: 43.6 % (ref 39.0–52.0)
Hemoglobin: 14.5 g/dL (ref 13.0–17.0)
MCH: 32.1 pg (ref 26.0–34.0)
MCHC: 33.3 g/dL (ref 30.0–36.0)
MCV: 96.5 fL (ref 80.0–100.0)
Platelets: 330 10*3/uL (ref 150–400)
RBC: 4.52 MIL/uL (ref 4.22–5.81)
RDW: 12.2 % (ref 11.5–15.5)
WBC: 7 10*3/uL (ref 4.0–10.5)
nRBC: 0 % (ref 0.0–0.2)

## 2021-12-20 LAB — RAPID URINE DRUG SCREEN, HOSP PERFORMED
Amphetamines: NOT DETECTED
Barbiturates: NOT DETECTED
Benzodiazepines: NOT DETECTED
Cocaine: NOT DETECTED
Opiates: NOT DETECTED
Tetrahydrocannabinol: NOT DETECTED

## 2021-12-20 LAB — I-STAT CHEM 8, ED
BUN: 18 mg/dL (ref 8–23)
Calcium, Ion: 1.17 mmol/L (ref 1.15–1.40)
Chloride: 101 mmol/L (ref 98–111)
Creatinine, Ser: 1.3 mg/dL — ABNORMAL HIGH (ref 0.61–1.24)
Glucose, Bld: 100 mg/dL — ABNORMAL HIGH (ref 70–99)
HCT: 46 % (ref 39.0–52.0)
Hemoglobin: 15.6 g/dL (ref 13.0–17.0)
Potassium: 4.3 mmol/L (ref 3.5–5.1)
Sodium: 135 mmol/L (ref 135–145)
TCO2: 22 mmol/L (ref 22–32)

## 2021-12-20 LAB — PROTIME-INR
INR: 1 (ref 0.8–1.2)
Prothrombin Time: 13.6 seconds (ref 11.4–15.2)

## 2021-12-20 LAB — DIFFERENTIAL
Abs Immature Granulocytes: 0.03 10*3/uL (ref 0.00–0.07)
Basophils Absolute: 0.1 10*3/uL (ref 0.0–0.1)
Basophils Relative: 2 %
Eosinophils Absolute: 0.3 10*3/uL (ref 0.0–0.5)
Eosinophils Relative: 5 %
Immature Granulocytes: 0 %
Lymphocytes Relative: 38 %
Lymphs Abs: 2.7 10*3/uL (ref 0.7–4.0)
Monocytes Absolute: 0.5 10*3/uL (ref 0.1–1.0)
Monocytes Relative: 7 %
Neutro Abs: 3.3 10*3/uL (ref 1.7–7.7)
Neutrophils Relative %: 48 %

## 2021-12-20 LAB — ETHANOL: Alcohol, Ethyl (B): 124 mg/dL — ABNORMAL HIGH (ref <10)

## 2021-12-20 LAB — APTT: aPTT: 30 seconds (ref 24–36)

## 2021-12-20 NOTE — ED Provider Triage Note (Signed)
Emergency Medicine Provider Triage Evaluation Note  Luis Brewer , a 76 y.o. male  was evaluated in triage.  Chief complaint of weakness.  Per EMS, patient last seen normal around approximately 10:30 PM last night.  His relative noted left-sided weakness, facial droop, as well as slurred speech.  EMS was then contacted.  Patient states that he has not drank alcohol for about 2 or 3 weeks but today "went on a bender" and drank about 1 pint of vodka.  Patient denies any physical complaints to me at this time.    Physical Exam  BP 137/84 (BP Location: Right Arm)    Pulse 94    Temp 98.2 F (36.8 C) (Oral)    Resp 14    SpO2 100%  Gen:   Awake, no distress   Resp:  Normal effort  MSK:   Moves extremities without difficulty  Other:  Strength 5/5 in the right arm and leg.  Difficult to assess due to patient noncompliance but appears to have mildly diminished strength in the left arm and leg.  Difficulty with extraocular movements to the left.  Medical Decision Making  Medically screening exam initiated at 11:04 PM.  Appropriate orders placed.  Penny Frisbie was informed that the remainder of the evaluation will be completed by another provider, this initial triage assessment does not replace that evaluation, and the importance of remaining in the ED until their evaluation is complete.  Concern for possible CVA.  Patient outside of tPA or IR window.  We will obtain stroke work-up as well as CT scan of the head.   Rayna Sexton, PA-C 12/20/21 2309
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
## 2021-12-21 ENCOUNTER — Inpatient Hospital Stay (HOSPITAL_COMMUNITY): Payer: Medicare (Managed Care)

## 2021-12-21 ENCOUNTER — Other Ambulatory Visit: Payer: Self-pay

## 2021-12-21 ENCOUNTER — Encounter (HOSPITAL_COMMUNITY): Payer: Self-pay | Admitting: Internal Medicine

## 2021-12-21 ENCOUNTER — Emergency Department (HOSPITAL_COMMUNITY): Payer: Medicare (Managed Care)

## 2021-12-21 DIAGNOSIS — E1169 Type 2 diabetes mellitus with other specified complication: Secondary | ICD-10-CM | POA: Diagnosis present

## 2021-12-21 DIAGNOSIS — Z8249 Family history of ischemic heart disease and other diseases of the circulatory system: Secondary | ICD-10-CM | POA: Diagnosis not present

## 2021-12-21 DIAGNOSIS — R29701 NIHSS score 1: Secondary | ICD-10-CM | POA: Diagnosis present

## 2021-12-21 DIAGNOSIS — I63511 Cerebral infarction due to unspecified occlusion or stenosis of right middle cerebral artery: Secondary | ICD-10-CM | POA: Diagnosis present

## 2021-12-21 DIAGNOSIS — F1010 Alcohol abuse, uncomplicated: Secondary | ICD-10-CM

## 2021-12-21 DIAGNOSIS — K219 Gastro-esophageal reflux disease without esophagitis: Secondary | ICD-10-CM | POA: Diagnosis not present

## 2021-12-21 DIAGNOSIS — F10129 Alcohol abuse with intoxication, unspecified: Secondary | ICD-10-CM | POA: Diagnosis present

## 2021-12-21 DIAGNOSIS — I10 Essential (primary) hypertension: Secondary | ICD-10-CM

## 2021-12-21 DIAGNOSIS — I63521 Cerebral infarction due to unspecified occlusion or stenosis of right anterior cerebral artery: Secondary | ICD-10-CM

## 2021-12-21 DIAGNOSIS — G8194 Hemiplegia, unspecified affecting left nondominant side: Secondary | ICD-10-CM | POA: Diagnosis present

## 2021-12-21 DIAGNOSIS — R29810 Facial weakness: Secondary | ICD-10-CM | POA: Diagnosis present

## 2021-12-21 DIAGNOSIS — E119 Type 2 diabetes mellitus without complications: Secondary | ICD-10-CM

## 2021-12-21 DIAGNOSIS — E782 Mixed hyperlipidemia: Secondary | ICD-10-CM | POA: Diagnosis present

## 2021-12-21 DIAGNOSIS — R531 Weakness: Secondary | ICD-10-CM | POA: Diagnosis present

## 2021-12-21 DIAGNOSIS — Z79899 Other long term (current) drug therapy: Secondary | ICD-10-CM | POA: Diagnosis not present

## 2021-12-21 DIAGNOSIS — Z8042 Family history of malignant neoplasm of prostate: Secondary | ICD-10-CM | POA: Diagnosis not present

## 2021-12-21 DIAGNOSIS — I639 Cerebral infarction, unspecified: Secondary | ICD-10-CM

## 2021-12-21 DIAGNOSIS — I6523 Occlusion and stenosis of bilateral carotid arteries: Secondary | ICD-10-CM | POA: Diagnosis present

## 2021-12-21 DIAGNOSIS — Z833 Family history of diabetes mellitus: Secondary | ICD-10-CM | POA: Diagnosis not present

## 2021-12-21 DIAGNOSIS — Z811 Family history of alcohol abuse and dependence: Secondary | ICD-10-CM | POA: Diagnosis not present

## 2021-12-21 DIAGNOSIS — F17210 Nicotine dependence, cigarettes, uncomplicated: Secondary | ICD-10-CM | POA: Diagnosis present

## 2021-12-21 DIAGNOSIS — Z20822 Contact with and (suspected) exposure to covid-19: Secondary | ICD-10-CM | POA: Diagnosis present

## 2021-12-21 DIAGNOSIS — Y906 Blood alcohol level of 120-199 mg/100 ml: Secondary | ICD-10-CM | POA: Diagnosis present

## 2021-12-21 DIAGNOSIS — Z85831 Personal history of malignant neoplasm of soft tissue: Secondary | ICD-10-CM | POA: Diagnosis not present

## 2021-12-21 LAB — CBC WITH DIFFERENTIAL/PLATELET
Abs Immature Granulocytes: 0.02 10*3/uL (ref 0.00–0.07)
Basophils Absolute: 0.1 10*3/uL (ref 0.0–0.1)
Basophils Relative: 1 %
Eosinophils Absolute: 0.3 10*3/uL (ref 0.0–0.5)
Eosinophils Relative: 5 %
HCT: 39.6 % (ref 39.0–52.0)
Hemoglobin: 13.7 g/dL (ref 13.0–17.0)
Immature Granulocytes: 0 %
Lymphocytes Relative: 37 %
Lymphs Abs: 2.3 10*3/uL (ref 0.7–4.0)
MCH: 33.6 pg (ref 26.0–34.0)
MCHC: 34.6 g/dL (ref 30.0–36.0)
MCV: 97.1 fL (ref 80.0–100.0)
Monocytes Absolute: 0.5 10*3/uL (ref 0.1–1.0)
Monocytes Relative: 8 %
Neutro Abs: 2.9 10*3/uL (ref 1.7–7.7)
Neutrophils Relative %: 49 %
Platelets: 291 10*3/uL (ref 150–400)
RBC: 4.08 MIL/uL — ABNORMAL LOW (ref 4.22–5.81)
RDW: 12.2 % (ref 11.5–15.5)
WBC: 6.1 10*3/uL (ref 4.0–10.5)
nRBC: 0 % (ref 0.0–0.2)

## 2021-12-21 LAB — GLUCOSE, CAPILLARY
Glucose-Capillary: 73 mg/dL (ref 70–99)
Glucose-Capillary: 90 mg/dL (ref 70–99)
Glucose-Capillary: 155 mg/dL — ABNORMAL HIGH (ref 70–99)

## 2021-12-21 LAB — COMPREHENSIVE METABOLIC PANEL
ALT: 25 U/L (ref 0–44)
AST: 37 U/L (ref 15–41)
Albumin: 3.4 g/dL — ABNORMAL LOW (ref 3.5–5.0)
Alkaline Phosphatase: 36 U/L — ABNORMAL LOW (ref 38–126)
Anion gap: 10 (ref 5–15)
BUN: 15 mg/dL (ref 8–23)
CO2: 18 mmol/L — ABNORMAL LOW (ref 22–32)
Calcium: 8.7 mg/dL — ABNORMAL LOW (ref 8.9–10.3)
Chloride: 105 mmol/L (ref 98–111)
Creatinine, Ser: 1.07 mg/dL (ref 0.61–1.24)
GFR, Estimated: >60 mL/min (ref >60)
Glucose, Bld: 94 mg/dL (ref 70–99)
Potassium: 5.4 mmol/L — ABNORMAL HIGH (ref 3.5–5.1)
Sodium: 133 mmol/L — ABNORMAL LOW (ref 135–145)
Total Bilirubin: 1.4 mg/dL — ABNORMAL HIGH (ref 0.3–1.2)
Total Protein: 6.5 g/dL (ref 6.5–8.1)

## 2021-12-21 LAB — ECHOCARDIOGRAM COMPLETE BUBBLE STUDY
Area-P 1/2: 3.06 cm2
S' Lateral: 3 cm

## 2021-12-21 LAB — CBG MONITORING, ED: Glucose-Capillary: 88 mg/dL (ref 70–99)

## 2021-12-21 LAB — HEMOGLOBIN A1C
Hgb A1c MFr Bld: 6.7 % — ABNORMAL HIGH (ref 4.8–5.6)
Mean Plasma Glucose: 145.59 mg/dL

## 2021-12-21 LAB — MAGNESIUM: Magnesium: 1.9 mg/dL (ref 1.7–2.4)

## 2021-12-21 LAB — RESP PANEL BY RT-PCR (FLU A&B, COVID) ARPGX2
Influenza A by PCR: NEGATIVE
Influenza B by PCR: NEGATIVE
SARS Coronavirus 2 by RT PCR: NEGATIVE

## 2021-12-21 LAB — LIPID PANEL
Cholesterol: 143 mg/dL (ref 0–200)
HDL: 38 mg/dL — ABNORMAL LOW (ref >40)
LDL Cholesterol: 92 mg/dL (ref 0–99)
Total CHOL/HDL Ratio: 3.8 RATIO
Triglycerides: 63 mg/dL (ref <150)
VLDL: 13 mg/dL (ref 0–40)

## 2021-12-21 MED ORDER — ASPIRIN 300 MG RE SUPP: 300.0000 mg | Freq: Every day | RECTAL | Status: DC

## 2021-12-21 MED ORDER — FOLIC ACID 1 MG PO TABS
1.0000 mg | ORAL_TABLET | Freq: Every day | ORAL | Status: DC
  Administered 2021-12-21 – 2021-12-22 (×2): 1 mg via ORAL
  Filled 2021-12-21 (×2): qty 1

## 2021-12-21 MED ORDER — LORAZEPAM 1 MG PO TABS: 0.0000 mg | ORAL_TABLET | Freq: Two times a day (BID) | ORAL | Status: DC

## 2021-12-21 MED ORDER — LORAZEPAM 1 MG PO TABS: 1.0000 mg | ORAL_TABLET | ORAL | Status: DC | PRN

## 2021-12-21 MED ORDER — LORAZEPAM 1 MG PO TABS
0.0000 mg | ORAL_TABLET | Freq: Four times a day (QID) | ORAL | Status: DC
  Filled 2021-12-21: qty 1

## 2021-12-21 MED ORDER — IOHEXOL 350 MG/ML SOLN
50.0000 mL | Freq: Once | INTRAVENOUS | Status: AC | PRN
  Administered 2021-12-21: 50 mL via INTRAVENOUS

## 2021-12-21 MED ORDER — THIAMINE HCL 100 MG/ML IJ SOLN: 100.0000 mg | Freq: Every day | INTRAMUSCULAR | Status: DC

## 2021-12-21 MED ORDER — LORAZEPAM 2 MG/ML IJ SOLN: 1.0000 mg | INTRAMUSCULAR | Status: DC | PRN

## 2021-12-21 MED ORDER — LORAZEPAM 2 MG/ML IJ SOLN: 0.0000 mg | Freq: Four times a day (QID) | INTRAMUSCULAR | Status: DC

## 2021-12-21 MED ORDER — NICOTINE 7 MG/24HR TD PT24
7.0000 mg | MEDICATED_PATCH | Freq: Every day | TRANSDERMAL | Status: DC
  Administered 2021-12-21: 7 mg via TRANSDERMAL
  Filled 2021-12-21: qty 1

## 2021-12-21 MED ORDER — LORAZEPAM 2 MG/ML IJ SOLN: 0.0000 mg | Freq: Two times a day (BID) | INTRAMUSCULAR | Status: DC

## 2021-12-21 MED ORDER — ASPIRIN 81 MG PO CHEW
81.0000 mg | CHEWABLE_TABLET | Freq: Every day | ORAL | Status: DC
  Administered 2021-12-21 – 2021-12-22 (×2): 81 mg via ORAL
  Filled 2021-12-21 (×2): qty 1

## 2021-12-21 MED ORDER — CLOPIDOGREL BISULFATE 300 MG PO TABS: 300.0000 mg | ORAL_TABLET | Freq: Once | ORAL | Status: DC

## 2021-12-21 MED ORDER — ONDANSETRON HCL 4 MG PO TABS: 4.0000 mg | ORAL_TABLET | Freq: Four times a day (QID) | ORAL | Status: DC | PRN

## 2021-12-21 MED ORDER — HYDRALAZINE HCL 20 MG/ML IJ SOLN
10.0000 mg | Freq: Four times a day (QID) | INTRAMUSCULAR | Status: DC | PRN
  Filled 2021-12-21: qty 1

## 2021-12-21 MED ORDER — STROKE: EARLY STAGES OF RECOVERY BOOK: Freq: Once | Status: DC

## 2021-12-21 MED ORDER — THIAMINE HCL 100 MG PO TABS
100.0000 mg | ORAL_TABLET | Freq: Every day | ORAL | Status: DC
  Administered 2021-12-21 – 2021-12-22 (×2): 100 mg via ORAL
  Filled 2021-12-21 (×2): qty 1

## 2021-12-21 MED ORDER — ACETAMINOPHEN 325 MG PO TABS: 650.0000 mg | ORAL_TABLET | Freq: Four times a day (QID) | ORAL | Status: DC | PRN

## 2021-12-21 MED ORDER — CLOPIDOGREL BISULFATE 75 MG PO TABS
75.0000 mg | ORAL_TABLET | Freq: Every day | ORAL | Status: DC
  Administered 2021-12-21 – 2021-12-22 (×2): 75 mg via ORAL
  Filled 2021-12-21 (×2): qty 1

## 2021-12-21 MED ORDER — ENOXAPARIN SODIUM 40 MG/0.4ML IJ SOSY
40.0000 mg | PREFILLED_SYRINGE | INTRAMUSCULAR | Status: DC
  Administered 2021-12-21 – 2021-12-22 (×2): 40 mg via SUBCUTANEOUS
  Filled 2021-12-21 (×2): qty 0.4

## 2021-12-21 MED ORDER — ONDANSETRON HCL 4 MG/2ML IJ SOLN: 4.0000 mg | Freq: Four times a day (QID) | INTRAMUSCULAR | Status: DC | PRN

## 2021-12-21 MED ORDER — POLYETHYLENE GLYCOL 3350 17 G PO PACK: 17.0000 g | PACK | Freq: Every day | ORAL | Status: DC | PRN

## 2021-12-21 MED ORDER — INSULIN ASPART 100 UNIT/ML IJ SOLN
0.0000 [IU] | Freq: Three times a day (TID) | INTRAMUSCULAR | Status: DC
  Administered 2021-12-21 – 2021-12-22 (×2): 3 [IU] via SUBCUTANEOUS

## 2021-12-21 MED ORDER — ADULT MULTIVITAMIN W/MINERALS CH
1.0000 | ORAL_TABLET | Freq: Every day | ORAL | Status: DC
  Administered 2021-12-21 – 2021-12-22 (×2): 1 via ORAL
  Filled 2021-12-21 (×2): qty 1

## 2021-12-21 MED ORDER — ACETAMINOPHEN 650 MG RE SUPP: 650.0000 mg | Freq: Four times a day (QID) | RECTAL | Status: DC | PRN

## 2021-12-21 MED ORDER — NICOTINE 14 MG/24HR TD PT24
14.0000 mg | MEDICATED_PATCH | Freq: Every day | TRANSDERMAL | Status: DC
  Administered 2021-12-22: 14 mg via TRANSDERMAL
  Filled 2021-12-21: qty 1

## 2021-12-21 MED ORDER — LORAZEPAM 1 MG PO TABS: 0.0000 mg | ORAL_TABLET | Freq: Four times a day (QID) | ORAL | Status: DC

## 2021-12-21 MED ORDER — THIAMINE HCL 100 MG PO TABS: 100.0000 mg | ORAL_TABLET | Freq: Every day | ORAL | Status: DC

## 2021-12-21 MED ORDER — ATORVASTATIN CALCIUM 40 MG PO TABS
40.0000 mg | ORAL_TABLET | Freq: Every day | ORAL | Status: DC
  Administered 2021-12-21 – 2021-12-22 (×2): 40 mg via ORAL
  Filled 2021-12-21 (×2): qty 1

## 2021-12-21 NOTE — Progress Notes (Signed)
°  Echocardiogram 2D Echocardiogram has been performed.  Johny Chess 12/21/2021, 11:37 AM
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
## 2021-12-21 NOTE — Assessment & Plan Note (Signed)
·   History of hyperlipidemia although I do not believe patient is taking anything currently  Continue lipid panel in the morning, initiating high intensity statin therapy if LDL is greater than 70

## 2021-12-21 NOTE — Progress Notes (Signed)
SLP Cancellation Note  Patient Details Name: Luis Brewer MRN: [id number] DOB: 05-28-1946   Cancelled treatment:       Reason Eval/Treat Not Completed: Patient at procedure or test/unavailable   Elvina Sidle, M.S., CCC-SLP 12/21/2021, 9:05 AM

## 2021-12-21 NOTE — Assessment & Plan Note (Signed)
•   Patient has longstanding history of alcohol abuse  Patient presently has alcohol in his system  Patient is at high risk of impending alcohol withdrawal  Initiating CIWA protocol  Patient will be administered scheduled doses of benzodiazepines in addition to further doses based on CIWA score

## 2021-12-21 NOTE — TOC CAGE-AID Note (Signed)
Transition of Care [facility]) - CAGE-AID Screening   Patient Details  Name: Luis Brewer MRN: [id number] Date of Birth: Dec 14, 1945  Transition of Care [facility]) CM/SW Contact:    Bailey Faiella C Tarpley-Carter, LCSWA Phone Number: 12/21/2021, 2:46 PM   Clinical Narrative: Pt participated in [place].  Pt stated he does not use substance or ETOH.  Pt was offered resources, due to usage of ETOH detected.  Jenavee Laguardia Tarpley-Carter, MSW, LCSW-A Pronouns:  She/Her/[place] Transitions of Care Clinical Social Worker Direct Number:  [phone] [email or web address]  CAGE-AID Screening: Substance Abuse Screening unable to be completed due to: : Patient unable to participate  Have You Ever Felt You Ought to Cut Down on Your Drinking or Drug Use?: No Have People Annoyed You By Critizing Your Drinking Or Drug Use?: No Have You Felt Bad Or Guilty About Your Drinking Or Drug Use?: No Have You Ever Had a Drink or Used Drugs First Thing In The Morning to Steady Your Nerves or to Get Rid of a Hangover?: No CAGE-AID Score: 0  Substance Abuse Education Offered: Yes  Substance abuse interventions: Scientist, clinical (histocompatibility and immunogenetics)

## 2021-12-21 NOTE — Progress Notes (Signed)
PROGRESS NOTE  Luis Brewer TDV:[id number] DOB: 1946-10-09 DOA: 12/20/2021 PCP: No primary care provider on file.  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 Patient reports that approximately 6 days ago he began to experience weakness in the left hand and noticed that he was dropping items, associated with numbness of the left upper extremity, some slurred speech as well. Upon evaluation in the emergency department a code stroke was not called and tPA was not administered due to patient being well outside of the tPA window.  CT imaging the head without contrast was initially done revealing a small acute cortical infarct in the right frontal parietal lobe.  Neurology consulted, patient admitted for further management.    Today, patient denies any new complaints, denies any chest pain, headaches, shortness of breath, nausea/vomiting, fever/chills.  No slurred speech noted.   Assessment/Plan: Principal Problem:   Acute ischemic right ACA stroke (HCC) Active Problems:   Type 2 diabetes mellitus without complication, without long-term current use of insulin ([place])   Essential hypertension   GERD without esophagitis   Alcohol abuse   Mixed diabetic hyperlipidemia associated with type 2 diabetes mellitus (HCC)   Acute ischemic right stroke MRI brain/MRA angio showed right ICA occlusion in the neck and at the skull base, noted widely scattered small mostly cortical and subcortical Right MCA territory infarcts with no associated hemorrhage or mass effect. Underlying chronic infarct in the posterior right MCA territory Echo showed EF of 50 to 55%, no regional wall motion abnormalities, no atrial level shunt noted LDL 92, A1c 6.7 Neurology consulted, plan for aspirin  and Plavix for 21 days, then aspirin alone Start aspirin, Plavix, Lipitor Advised to quit/limit alcohol intake as well as quit smoking PT/OT/SLP  Diabetes mellitus type 2 A1c 6.7 SSI, Accu-Cheks, hypoglycemic protocol  Hyperlipidemia Continue statins as above  Hypertension Permissive hypertension for now  GERD Continue PPI  Alcohol abuse Advised to quit CIWA protocol for now MVT, thiamine, folic acid  Tobacco abuse Advised to quit Nicotine patch      Estimated body mass index is 26.58 kg/m as calculated from the following:   Height as of 06/25/19: 5\' 9"  (1.753 m).   Weight as of 06/25/19: 81.6 kg.     Code Status: Full  Family Communication: None at bedside  Disposition Plan: Status is: Inpatient Remains inpatient appropriate because: Level of care     Consultants: Neurology  Procedures: None  Antimicrobials: None  DVT prophylaxis: Lovenox   Objective: Vitals:   12/21/21 0641 12/21/21 1023 12/21/21 1139 12/21/21 1225  BP: (!) 174/75 (!) 157/71 (!) 157/71 113/77  Pulse: 61 60 60 (!) 54  Resp: 19 16  16   Temp:    98 F (36.7 C)  TempSrc:    Oral  SpO2: 94% 92%     No intake or output data in the 24 hours ending 12/21/21 1655 There were no vitals filed for this visit.  Exam:  General: NAD  Cardiovascular: S1, S2 present Respiratory: CTAB Abdomen: Soft, nontender, nondistended, bowel sounds present Musculoskeletal: No bilateral pedal edema noted Skin: Normal Psychiatry: Normal mood Neurology: Strength 5/5 RUE, RLE and LLE, 4/5 on LUE   Data Reviewed: CBC: Recent Labs  Lab 12/20/21 2313 12/20/21 2324 12/21/21 0331  WBC 7.0  --  6.1  NEUTROABS 3.3  --  2.9  HGB 14.5 15.6 13.7  HCT 43.6 46.0 39.6  MCV 96.5  --  97.1  PLT 330  --  329   Basic Metabolic Panel: Recent Labs  Lab 12/20/21 2313 12/20/21 2324 12/21/21 0331  NA 131* 135 133*  K 4.6 4.3 5.4*  CL 100 101 105  CO2 21*  --  18*  GLUCOSE 109* 100* 94  BUN 16 18 15    CREATININE 1.17 1.30* 1.07  CALCIUM 9.2  --  8.7*  MG  --   --  1.9   GFR: CrCl cannot be calculated (Unknown ideal weight.). Liver Function Tests: Recent Labs  Lab 12/20/21 2313 12/21/21 0331  AST 29 37  ALT 23 25  ALKPHOS 42 36*  BILITOT 0.3 1.4*  PROT 7.7 6.5  ALBUMIN 3.8 3.4*   No results for input(s): LIPASE, AMYLASE in the last 168 hours. No results for input(s): AMMONIA in the last 168 hours. Coagulation Profile: Recent Labs  Lab 12/20/21 2313  INR 1.0   Cardiac Enzymes: No results for input(s): CKTOTAL, CKMB, CKMBINDEX, TROPONINI in the last 168 hours. BNP (last 3 results) No results for input(s): PROBNP in the last 8760 hours. HbA1C: Recent Labs    12/21/21 0332  HGBA1C 6.7*   CBG: Recent Labs  Lab 12/21/21 0809 12/21/21 1134 12/21/21 1633  GLUCAP 88 73 90   Lipid Profile: Recent Labs    12/21/21 0331  CHOL 143  HDL 38*  LDLCALC 92  TRIG 63  CHOLHDL 3.8   Thyroid Function Tests: No results for input(s): TSH, T4TOTAL, FREET4, T3FREE, THYROIDAB in the last 72 hours. Anemia Panel: No results for input(s): VITAMINB12, FOLATE, FERRITIN, TIBC, IRON, RETICCTPCT in the last 72 hours. Urine analysis:    Component Value Date/Time   COLORURINE YELLOW 12/20/2021 [place] 12/20/2021 2303   LABSPEC 1.025 12/20/2021 2303   PHURINE 5.5 12/20/2021 2303   GLUCOSEU NEGATIVE 12/20/2021 2303   GLUCOSEU NEGATIVE 06/15/2011 1709   HGBUR NEGATIVE 12/20/2021 2303   BILIRUBINUR NEGATIVE 12/20/2021 2303   KETONESUR NEGATIVE 12/20/2021 2303   PROTEINUR NEGATIVE 12/20/2021 2303   UROBILINOGEN 4.0 06/15/2011 1709   NITRITE NEGATIVE 12/20/2021 2303   LEUKOCYTESUR NEGATIVE 12/20/2021 2303   Sepsis Labs: @LABRCNTIP (procalcitonin:4,lacticidven:4)  ) Recent Results (from the past 240 hour(s))  Resp Panel by RT-PCR (Flu A&B, Covid) Nasopharyngeal Swab     Status: None   Collection Time: 12/20/21 11:03 PM   Specimen: Nasopharyngeal Swab;  Nasopharyngeal(NP) swabs in vial transport medium  Result Value Ref Range Status   SARS Coronavirus 2 by RT PCR NEGATIVE NEGATIVE Final    Comment: (NOTE) SARS-CoV-2 target nucleic acids are NOT DETECTED.  The SARS-CoV-2 RNA is generally detectable in upper respiratory specimens during the acute phase of infection. The lowest concentration of SARS-CoV-2 viral copies this assay can detect is 138 copies/mL. A negative result does not preclude SARS-Cov-2 infection and should not be used as the sole basis for treatment or other patient management decisions. A negative result may occur with  improper specimen collection/handling, submission of specimen other than nasopharyngeal swab, presence of viral mutation(s) within the areas targeted by this assay, and inadequate number of viral copies(<138 copies/mL). A negative result must be combined with clinical observations, patient history, and epidemiological information. The expected result is Negative.  Fact Sheet for Patients:  EntrepreneurPulse.com.au  Fact Sheet for Healthcare Providers:  IncredibleEmployment.be  This test is no t yet approved or cleared by the  [place] [employer]ural [employer] and  has been authorized for detection and/or diagnosis of SARS-CoV-2 by FDA under an Print production planner (EUA). This EUA will remain  in effect (meaning this test can be used) for the duration of the COVID-19 declaration under Section 564(b)(1) of the Act, 21 U.S.C.section 360bbb-3(b)(1), unless the authorization is terminated  or revoked sooner.       Influenza A by PCR NEGATIVE NEGATIVE Final   Influenza B by PCR NEGATIVE NEGATIVE Final    Comment: (NOTE) The Xpert Xpress SARS-CoV-2/FLU/RSV plus assay is intended as an aid in the diagnosis of influenza from Nasopharyngeal swab specimens and should not be used as a sole basis for treatment. Nasal washings and aspirates are unacceptable for Xpert Xpress  SARS-CoV-2/FLU/RSV testing.  Fact Sheet for Patients: EntrepreneurPulse.com.au  Fact Sheet for Healthcare Providers: IncredibleEmployment.be  This test is not yet approved or cleared by the [place] FDA and has been authorized for detection and/or diagnosis of SARS-CoV-2 by FDA under an Emergency Use Authorization (EUA). This EUA will remain in effect (meaning this test can be used) for the duration of the COVID-19 declaration under Section 564(b)(1) of the Act, 21 U.S.C. section 360bbb-3(b)(1), unless the authorization is terminated or revoked.  Performed at [facility], [place] [address], [place], [place] [address]       Studies: CT ANGIO HEAD NECK W WO CM  Result Date: 12/21/2021 CLINICAL DATA:  76 year old male right ICA occlusion and scattered right MCA infarcts on MRI/MRA today. EXAM: CT ANGIOGRAPHY HEAD AND NECK TECHNIQUE: Multidetector CT imaging of the head and neck was performed using the standard protocol during bolus administration of intravenous contrast. Multiplanar CT image reconstructions and MIPs were obtained to evaluate the vascular anatomy. Carotid stenosis measurements (when applicable) are obtained utilizing NASCET criteria, using the distal internal carotid diameter as the denominator. RADIATION DOSE REDUCTION: This exam was performed according to the departmental dose-optimization program which includes automated exposure control, adjustment of the mA and/or kV according to patient size and/or use of iterative reconstruction technique. CONTRAST:  40mL OMNIPAQUE IOHEXOL 350 MG/ML SOLN COMPARISON:  Brain MRI and MRA earlier today. Plain head CT 0005 hours today. FINDINGS: CTA NECK Skeleton: Absent maxillary dentition. Bulky cervical spine disc and endplate degeneration. No acute osseous abnormality identified. Upper chest: Bullous emphysema right lung apex. Moderate to severe centrilobular emphysema elsewhere in the visible  upper lobes. Visible trachea is clear, negative. No superior mediastinal lymphadenopathy. Other neck: Negative. Stable visible brain parenchyma from earlier today. Aortic arch: 3 vessel arch configuration. Moderate calcified arch atherosclerosis. Right carotid system: Brachiocephalic and right CCA origin plaque without stenosis. Mildly tortuous right CCA with widespread soft and calcified plaque but no stenosis proximal to the carotid bifurcation. Bulky calcified atherosclerosis at the right ICA origin. The right ICA bulb remains patent but contrast tapers distal to the bulb and the vessel is fully occluded by C2. No reconstitution to the skull base. Left carotid system: Widespread atherosclerosis but no hemodynamically significant stenosis. Mildly tortuous cervical left ICA. Vertebral arteries: Proximal right subclavian artery atherosclerosis without stenosis. Atherosclerosis and severe stenosis of the right vertebral artery origin and V1 segment (series 8, image 185) but the vessel remains patent. Non dominant appearing right vertebral artery is otherwise patent to the skull base. Proximal left subclavian atherosclerosis without stenosis. Calcified plaque at the left vertebral artery origin but only mild stenosis on series 8, image 174. Dominant left vertebral artery is patent to the skull base without additional plaque  or stenosis. CTA HEAD Posterior circulation: Dominant left vertebral artery is patent to the vertebrobasilar junction with mild irregularity and no stenosis. Non dominant right V4 segment with calcified plaque and mild stenosis proximal to the right PICA origin. The vessel remains patent to the vertebrobasilar junction. Left AICA appears dominant and patent. Patent basilar artery with mild irregularity, no significant stenosis. Patent SCA and PCA origins. Posterior communicating arteries are diminutive or absent. Mild to moderate irregularity of the bilateral PCA branches, with moderate stenosis at  the left P1/P2 and P2/P3 junctions (series 12, image 24). Distal PCA branches remain patent. Anterior circulation: No enhancement of the right ICA siphon until the level of the ophthalmic artery. Supraclinoid segment is patent with moderate calcified plaque and mild stenosis. Right MCA origin and M1 segment are patent (series 11, image 20). Right MCA bifurcation is patent. Right MCA branches are diminutive and/or absent compared to the left side. The right ACA A1 segment is probably diminutive or absent rather than occluded. Normal anterior communicating artery. ACA branches are within normal limits. Left ICA siphon is patent with moderate calcified plaque. And up to moderate supraclinoid segment stenosis (series 7, image 124). Patent left ICA terminus. Patent left MCA and ACA origins. Left ACA A1 segment irregularity and stenosis is mild by CTA (series 11, image 19). Left MCA M1 segment and bifurcation are patent without stenosis. Left MCA branches are within normal limits. Venous sinuses: Early contrast timing, not well evaluated. Anatomic variants: Dominant left vertebral artery. Dominant left and diminutive or absent right ACA A1 segments. Review of the MIP images confirms the above findings IMPRESSION: 1. 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 Supraclinoid ICA reconstituted from the ophthalmic artery. Right MCA M1 is patent but diminutive with an intermediate level of reconstituted right MCA branches. 2. No other large vessel occlusion. Dominant left ACA and anterior communicating arteries supply the right ACA. With left ACA A1 stenosis mild by CTA. 3. Other atherosclerosis in the head and neck: Up to moderate Left ICA supraclinoid segment stenosis. Moderate to severe origin stenosis of the non dominant right vertebral artery due to plaque. Moderate tandem stenoses of the Left PCA. 4. Aortic Atherosclerosis (ICD10-I70.0) and Emphysema (ICD10-J43.9). Electronically Signed   By: Genevie Ann  M.D.   On: 12/21/2021 07:29   CT HEAD WO CONTRAST (5MM)  Result Date: 12/21/2021 CLINICAL DATA:  Evaluate for acute stroke. Left-sided weakness and facial droop. Slurred speech. EXAM: CT HEAD WITHOUT CONTRAST TECHNIQUE: Contiguous axial images were obtained from the base of the skull through the vertex without intravenous contrast. RADIATION DOSE REDUCTION: This exam was performed according to the departmental dose-optimization program which includes automated exposure control, adjustment of the mA and/or kV according to patient size and/or use of iterative reconstruction technique. COMPARISON:  CT head 06/25/2019. FINDINGS: Brain: There is small acute cortical infarct in the high right frontal parietal lobe. There is hypodensity with loss of gray-white matter distinction. There is sulcal effacement. There is no acute intracranial hemorrhage, significant mass effect or midline shift. Mild diffuse atrophy is unchanged. No hydrocephalus. No extra-axial fluid collection. Vascular: Atherosclerotic calcifications are present within the cavernous internal carotid arteries. Skull: Normal. Negative for fracture or focal lesion. Sinuses/Orbits: No acute finding. Other: None. IMPRESSION: 1. 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 No mass effect or midline shift. No acute intracranial hemorrhage. These results were called by telephone at the time of interpretation on 12/21/2021 at 12:10  am to provider Thereasa Solo, who verbally acknowledged these results. Electronically Signed   By: Ronney Asters M.D.   On: 12/21/2021 00:12   MR ANGIO HEAD WO CONTRAST  Addendum Date: 12/21/2021   ADDENDUM REPORT: 12/21/2021 06:30 ADDENDUM: Study discussed by telephone with Dr. Inda Merlin on 12/21/2021 at 0625 hours. Electronically Signed   By: Genevie Ann M.D.   On: 12/21/2021 06:30   Result Date: 12/21/2021 CLINICAL DATA:  76 year old male left side weakness, slurred speech and facial droop. EXAM: MRI HEAD  WITHOUT CONTRAST MRA HEAD WITHOUT CONTRAST TECHNIQUE: Multiplanar, multi-echo pulse sequences of the brain and surrounding structures were acquired without intravenous contrast. Angiographic images of the Circle of Willis were acquired using MRA technique without intravenous contrast. COMPARISON:  Head CT 0005 hours today. FINDINGS: MRI HEAD FINDINGS Brain: Widely scattered small areas of cortical, subcortical, and occasionally central white matter restricted diffusion in the right hemisphere primarily corresponding to the right MCA territory. Right middle and superior gyri posteriorly most affected, with associated T2 and FLAIR hyperintense cytotoxic edema. But superimposed area of chronic encephalomalacia in the superior right parietal lobe which seems to correspond to the dominant CT finding earlier today (series 6, image 45). Possible petechial hemorrhage (series 15, image 44) there. No definite acute or malignant hemorrhagic transformation. No significant intracranial mass effect. No contralateral left hemisphere or posterior fossa restricted diffusion. No other chronic cerebral blood products identified. Mild ventricular prominence appears to be ex vacuo related. And there is also mild encephalomalacia along the central cerebellar vermis (series 11, image 8). No midline shift, mass effect, evidence of mass lesion. Cervicomedullary junction and pituitary are within normal limits. Vascular: Absent right ICA flow void in the neck and through most of the right ICA siphon (series 10, image 9). Evidence of some reconstituted flow in the right anterior circulation branches. Other Major intracranial vascular flow voids are preserved. Skull and upper cervical spine: Cervical spine disc and endplate degeneration. Visualized bone marrow signal is within normal limits. Sinuses/Orbits: Postoperative changes to both globes. Scattered mild to moderate paranasal sinus mucosal thickening. Other: Trace bilateral mastoid air  cell fluid. Negative visible nasopharynx. MRA HEAD FINDINGS Study is intermittently degraded by motion artifact despite repeated imaging attempts. Anterior circulation: Absent antegrade flow right ICA siphon. Preserved anterior communicating artery flow which appears to supply the right ACA. Poor flow signal at the right MCA origin and and right MCA branches. Antegrade flow in the left ICA siphon with mild siphon irregularity and stenosis. Patent left ICA terminus. Patent left MCA and ACA origins. M but there is a moderate stenosis of the distal ild stenosis left ACA A1 segment. Left MCA M1 segment and left MCA bifurcation are patent without stenosis. Visible left MCA branches are within normal limits. Posterior circulation: Antegrade flow in the posterior circulation with mildly dominant left vertebral artery. Patent right PICA and left AICA origins. No distal vertebral or proximal basilar artery stenosis. But there is mild to moderate distal basilar irregularity. Only mild distal basilar stenosis. SCA and PCA origins are patent with mild irregularity. Bilateral PCA branches are patent with mild to moderate irregularity greater on the left. Anatomic variants: Mildly dominant left vertebral artery. IMPRESSION: 1. Positive for Right ICA Occlusion in the neck and at the skull base. Associated widely scattered small mostly cortical and subcortical Right MCA territory infarcts with no associated hemorrhage or mass effect. Underlying chronic infarct in the posterior right MCA territory (superior parietal lobe) with hemosiderin. 2. Right ACA collateralized  by the anterior communicating artery. But there is a moderate stenosis of the Left ACA A1 segment. 3. Other intracranial atherosclerosis on MRA with mild stenoses of the Basilar Artery, bilateral PCA branches. Electronically Signed: By: Genevie Ann M.D. On: 12/21/2021 06:09   MR BRAIN WO CONTRAST  Addendum Date: 12/21/2021   ADDENDUM REPORT: 12/21/2021 06:30 ADDENDUM:  Study discussed by telephone with Dr. Inda Merlin on 12/21/2021 at 0625 hours. Electronically Signed   By: Genevie Ann M.D.   On: 12/21/2021 06:30   Result Date: 12/21/2021 CLINICAL DATA:  76 year old male left side weakness, slurred speech and facial droop. EXAM: MRI HEAD WITHOUT CONTRAST MRA HEAD WITHOUT CONTRAST TECHNIQUE: Multiplanar, multi-echo pulse sequences of the brain and surrounding structures were acquired without intravenous contrast. Angiographic images of the Circle of Willis were acquired using MRA technique without intravenous contrast. COMPARISON:  Head CT 0005 hours today. FINDINGS: MRI HEAD FINDINGS Brain: Widely scattered small areas of cortical, subcortical, and occasionally central white matter restricted diffusion in the right hemisphere primarily corresponding to the right MCA territory. Right middle and superior gyri posteriorly most affected, with associated T2 and FLAIR hyperintense cytotoxic edema. But superimposed area of chronic encephalomalacia in the superior right parietal lobe which seems to correspond to the dominant CT finding earlier today (series 6, image 45). Possible petechial hemorrhage (series 15, image 44) there. No definite acute or malignant hemorrhagic transformation. No significant intracranial mass effect. No contralateral left hemisphere or posterior fossa restricted diffusion. No other chronic cerebral blood products identified. Mild ventricular prominence appears to be ex vacuo related. And there is also mild encephalomalacia along the central cerebellar vermis (series 11, image 8). No midline shift, mass effect, evidence of mass lesion. Cervicomedullary junction and pituitary are within normal limits. Vascular: Absent right ICA flow void in the neck and through most of the right ICA siphon (series 10, image 9). Evidence of some reconstituted flow in the right anterior circulation branches. Other Major intracranial vascular flow voids are preserved. Skull and upper  cervical spine: Cervical spine disc and endplate degeneration. Visualized bone marrow signal is within normal limits. Sinuses/Orbits: Postoperative changes to both globes. Scattered mild to moderate paranasal sinus mucosal thickening. Other: Trace bilateral mastoid air cell fluid. Negative visible nasopharynx. MRA HEAD FINDINGS Study is intermittently degraded by motion artifact despite repeated imaging attempts. Anterior circulation: Absent antegrade flow right ICA siphon. Preserved anterior communicating artery flow which appears to supply the right ACA. Poor flow signal at the right MCA origin and and right MCA branches. Antegrade flow in the left ICA siphon with mild siphon irregularity and stenosis. Patent left ICA terminus. Patent left MCA and ACA origins. M but there is a moderate stenosis of the distal ild stenosis left ACA A1 segment. Left MCA M1 segment and left MCA bifurcation are patent without stenosis. Visible left MCA branches are within normal limits. Posterior circulation: Antegrade flow in the posterior circulation with mildly dominant left vertebral artery. Patent right PICA and left AICA origins. No distal vertebral or proximal basilar artery stenosis. But there is mild to moderate distal basilar irregularity. Only mild distal basilar stenosis. SCA and PCA origins are patent with mild irregularity. Bilateral PCA branches are patent with mild to moderate irregularity greater on the left. Anatomic variants: Mildly dominant left vertebral artery. IMPRESSION: 1. Positive for Right ICA Occlusion in the neck and at the skull base. Associated widely scattered small mostly cortical and subcortical Right MCA territory infarcts with no associated hemorrhage or mass effect.  Underlying chronic infarct in the posterior right MCA territory (superior parietal lobe) with hemosiderin. 2. Right ACA collateralized by the anterior communicating artery. But there is a moderate stenosis of the Left ACA A1 segment. 3.  Other intracranial atherosclerosis on MRA with mild stenoses of the Basilar Artery, bilateral PCA branches. Electronically Signed: By: Genevie Ann M.D. On: 12/21/2021 06:09   ECHOCARDIOGRAM COMPLETE BUBBLE STUDY  Result Date: 12/21/2021    ECHOCARDIOGRAM REPORT   Patient Name:   Luis Brewer Date of Exam: 12/21/2021 Medical Rec #:  [id number]     Height:       69.0 in Accession #:    [id number]    Weight:       180.0 lb Date of Birth:  [birth year]     BSA:          1.976 m Patient Age:    42 years      BP:           157/71 mmHg Patient Gender: M             HR:           52 bpm. Exam Location:  Inpatient Procedure: 2D Echo Indications:    stroke  History:        Patient has no prior history of Echocardiogram examinations.                 Risk Factors:Diabetes, Hypertension and Dyslipidemia.  Sonographer:    Johny Chess RDCS Referring Phys: [id number] Sherryll Burger [facility]  Sonographer Comments: Image acquisition challenging due to respiratory motion. IMPRESSIONS  1. Left ventricular ejection fraction, by estimation, is 50 to 55%. The left ventricle has low normal function. The left ventricle has no regional wall motion abnormalities. Left ventricular diastolic parameters were normal.  2. Right ventricular systolic function is normal. The right ventricular size is normal. There is normal pulmonary artery systolic pressure.  3. The mitral valve is normal in structure. Trivial mitral valve regurgitation. No evidence of mitral stenosis.  4. The aortic valve is tricuspid. Aortic valve regurgitation is not visualized. No aortic stenosis is present.  5. The inferior vena cava is normal in size with greater than 50% respiratory variability, suggesting right atrial pressure of 3 mmHg. Comparison(s): No prior Echocardiogram. FINDINGS  Left Ventricle: Left ventricular ejection fraction, by estimation, is 50 to 55%. The left ventricle has low normal function. The left ventricle has no regional wall motion abnormalities. The left  ventricular internal cavity size was normal in size. There is no left ventricular hypertrophy. Left ventricular diastolic parameters were normal. Right Ventricle: The right ventricular size is normal. No increase in right ventricular wall thickness. Right ventricular systolic function is normal. There is normal pulmonary artery systolic pressure. The tricuspid regurgitant velocity is 2.10 m/s, and  with an assumed right atrial pressure of 3 mmHg, the estimated right ventricular systolic pressure is 45.6 mmHg. Left Atrium: Left atrial size was normal in size. Right Atrium: Right atrial size was normal in size. Pericardium: There is no evidence of pericardial effusion. Mitral Valve: The mitral valve is normal in structure. Trivial mitral valve regurgitation. No evidence of mitral valve stenosis. Tricuspid Valve: The tricuspid valve is normal in structure. Tricuspid valve regurgitation is mild . No evidence of tricuspid stenosis. Aortic Valve: The aortic valve is tricuspid. Aortic valve regurgitation is not visualized. No aortic stenosis is present. Pulmonic Valve: The pulmonic valve was normal in structure. Pulmonic valve regurgitation is not visualized. No evidence  of pulmonic stenosis. Aorta: The aortic root is normal in size and structure. Venous: The inferior vena cava is normal in size with greater than 50% respiratory variability, suggesting right atrial pressure of 3 mmHg. IAS/Shunts: No atrial level shunt detected by color flow Doppler.  LEFT VENTRICLE PLAX 2D LVIDd:         4.60 cm   Diastology LVIDs:         3.00 cm   LV e' medial:    8.16 cm/s LV PW:         0.90 cm   LV E/e' medial:  10.1 LV IVS:        0.90 cm   LV e' lateral:   9.57 cm/s LVOT diam:     2.30 cm   LV E/e' lateral: 8.6 LV SV:         76 LV SV Index:   39 LVOT Area:     4.15 cm  RIGHT VENTRICLE RV S prime:     8.59 cm/s TAPSE (M-mode): 1.3 cm LEFT ATRIUM           Index        RIGHT ATRIUM           Index LA diam:      3.10 cm 1.57 cm/m    RA Area:     11.20 cm LA Vol (A4C): 43.5 ml 22.02 ml/m  RA Volume:   25.90 ml  13.11 ml/m  AORTIC VALVE LVOT Vmax:   89.30 cm/s LVOT Vmean:  56.800 cm/s LVOT VTI:    0.184 m  AORTA Ao Root diam: 3.20 cm Ao Asc diam:  3.20 cm MITRAL VALVE               TRICUSPID VALVE MV Area (PHT): 3.06 cm    TR Peak grad:   17.6 mmHg MV Decel Time: 248 msec    TR Vmax:        210.00 cm/s MV E velocity: 82.50 cm/s MV A velocity: 57.50 cm/s  SHUNTS MV E/A ratio:  1.43        Systemic VTI:  0.18 m                            Systemic Diam: 2.30 cm Kardie Tobb DO Electronically signed by Berniece Salines DO Signature Date/Time: 12/21/2021/4:21:14 PM    Final     Scheduled Meds:   stroke: mapping our early stages of recovery book   Does not apply Once   aspirin  81 mg Oral Daily   Or   aspirin  300 mg Rectal Daily   atorvastatin  40 mg Oral Daily   clopidogrel  75 mg Oral Daily   enoxaparin (LOVENOX) injection  40 mg Subcutaneous P82U   folic acid  1 mg Oral Daily   insulin aspart  0-15 Units Subcutaneous TID AC & HS   LORazepam  0-4 mg Oral Q6H   Followed by   Derrill Memo ON 12/23/2021] LORazepam  0-4 mg Oral Q12H   multivitamin with minerals  1 tablet Oral Daily   nicotine  7 mg Transdermal Daily   thiamine  100 mg Oral Daily   Or   thiamine  100 mg Intravenous Daily    Continuous Infusions:   LOS: 0 days     Alma Friendly, MD [facility]  If 7PM-7AM, please contact night-coverage [email or web address] 12/21/2021, 4:55 PM

## 2021-12-21 NOTE — Assessment & Plan Note (Signed)
.   Patient been placed on Accu-Cheks before every meal and nightly with sliding scale insulin . Holding home regimen of hypoglycemics . Hemoglobin A1C ordered . Diabetic Diet  

## 2021-12-21 NOTE — TOC CAGE-AID Note (Signed)
Transition of Care [facility]) - CAGE-AID Screening   Patient Details  Name: Luis Brewer MRN: [id number] Date of Birth: 05-May-1946  Transition of Care [facility]) CM/SW Contact:    Alby Schwabe C Tarpley-Carter, [place] Phone Number: 12/21/2021, 9:54 AM   Clinical Narrative: Pt is unable to participate in Cage Aid. CSW will assess pt at a better time.  Acelin Ferdig Tarpley-Carter, MSW, LCSW-A Pronouns:  She/Her/Hers  Transitions of Care Clinical Social Worker Direct Number:  [phone] [email or web address]  CAGE-AID Screening: Substance Abuse Screening unable to be completed due to: : Patient unable to participate             Substance Abuse Education Offered: Yes  Substance abuse interventions: Scientist, clinical (histocompatibility and immunogenetics)

## 2021-12-21 NOTE — Evaluation (Signed)
Occupational Therapy Evaluation Patient Details Name: Luis Brewer MRN: [id number] DOB: 22-May-1946 Today's Date: 12/21/2021   History of Present Illness Luis Brewer is a 76 y.o. male who presented with LUE weakness and slurred speech. MRI (+) scattered right MCA infarcts with chronic posterior right MCA infarct. PMHx: cancer, depression, DM type II, HTN   Clinical Impression   Luis Brewer is generally mod I PTA with use of SPC for mobility, he does not drive and his daughter assists with finance management, he states he does not take medications. He lives alone in a first level apartment, his daughter and a friend are near by to assist as needed. Upon evaluation pt was min guard for mobility without AD, likely to do well with SPC and up to min guard for OOB ADLs. He does have weakness, decreased sensation and impaired proprioception of his RUE and benefits from verbal cues to encourage appropriate use. Pt will benefit from OT acutely to progress impairments listed below. Recommend d/c to home with neuro OP OT to address RUE.     Recommendations for follow up therapy are one component of a multi-disciplinary discharge planning process, led by the attending physician.  Recommendations may be updated based on patient status, additional functional criteria and insurance authorization.   Follow Up Recommendations  Outpatient OT (nero OP OT)    Assistance Recommended at Discharge Intermittent Supervision/Assistance  Patient can return home with the following Assist for transportation    Functional Status Assessment  Patient has had a recent decline in their functional status and demonstrates the ability to make significant improvements in function in a reasonable and predictable amount of time.  Equipment Recommendations  None recommended by OT       Precautions / Restrictions Precautions Precautions: Fall Restrictions Weight Bearing Restrictions: No      Mobility Bed Mobility Overal bed  mobility: Independent                  Transfers Overall transfer level: Needs assistance Equipment used: None Transfers: Sit to/from Stand Sit to Stand: Min guard           General transfer comment: no AD, pt uses SPC at baseline      Balance Overall balance assessment: Needs assistance Sitting-balance support: Feet supported Sitting balance-Leahy Scale: Good     Standing balance support: No upper extremity supported, During functional activity Standing balance-Leahy Scale: Fair                             ADL either performed or assessed with clinical judgement   ADL Overall ADL's : Needs assistance/impaired Eating/Feeding: Independent;Sitting   Grooming: Supervision/safety;Standing   Upper Body Bathing: Set up;Sitting   Lower Body Bathing: Min guard;Sit to/from stand   Upper Body Dressing : Set up;Sitting   Lower Body Dressing: Min guard;Sit to/from stand   Toilet Transfer: Min guard   Toileting- Architect and Hygiene: Supervision/safety;Sit to/from stand       Functional mobility during ADLs: Min guard General ADL Comments: Pt is mildly impulsive with limited insight to deficits, benefits form verbal cues. mildly limited by LUE proprioception, impaired sensation and weakness     Vision Baseline Vision/History: 0 No visual deficits Vision Assessment?: No apparent visual deficits     Perception     Praxis      Pertinent Vitals/Pain Pain Assessment Pain Assessment: No/denies pain     Hand Dominance Right   Extremity/Trunk Assessment Upper  Extremity Assessment Upper Extremity Assessment: LUE deficits/detail RUE Deficits / Details: WNL LUE Deficits / Details: impaired finger to nose, 4-/5 MMT globally, ROM is WFL, mild paresthesias in hand LUE Sensation: decreased light touch;decreased proprioception LUE Coordination: decreased fine motor   Lower Extremity Assessment Lower Extremity Assessment: Defer to PT  evaluation   Cervical / Trunk Assessment Cervical / Trunk Assessment: Normal   Communication Communication Communication: No difficulties   Cognition Arousal/Alertness: Awake/alert Behavior During Therapy: WFL for tasks assessed/performed Overall Cognitive Status: No family/caregiver present to determine baseline cognitive functioning                                 General Comments: Pt mildly impulsive, poor insight to deficts and safety. A&Ox4     General Comments  VSS onRA            Home Living Family/patient expects to be discharged to:: Private residence Living Arrangements: Alone Available Help at Discharge: Family;Friend(s);Available PRN/intermittently Type of Home: Apartment Home Access: Level entry     Home Layout: One level     Bathroom Shower/Tub: Chief Strategy Officer: Standard     Home Equipment: Cane - single point   Additional Comments: daughter near by to assist as needed, pt has a friend who also assists as needed      Prior Functioning/Environment Prior Level of Function : Needs assist             Mobility Comments: SPC for all mobility ADLs Comments: indep, does not drive, daughter assists with finance mgmt & "I don't take no meducation."        OT Problem List: Decreased strength;Decreased range of motion;Decreased coordination;Impaired UE functional use      OT Treatment/Interventions: Self-care/ADL training;Therapeutic exercise;Balance training;Patient/family education;Therapeutic activities;DME and/or AE instruction    OT Goals(Current goals can be found in the care plan section) Acute Rehab OT Goals Patient Stated Goal: stronger R hand OT Goal Formulation: With patient Time For Goal Achievement: 01/04/22 Potential to Achieve Goals: Good ADL Goals Pt Will Transfer to Toilet: with modified independence;ambulating Pt Will Perform Tub/Shower Transfer: with modified independence;ambulating;Tub  transfer Pt/caregiver will Perform Home Exercise Program: Increased ROM;Increased strength;Right Upper extremity;With written HEP provided  OT Frequency: Min 2X/week       AM-PAC OT "6 Clicks" Daily Activity     Outcome Measure Help from another person eating meals?: None Help from another person taking care of personal grooming?: A Little Help from another person toileting, which includes using toliet, bedpan, or urinal?: A Little Help from another person bathing (including washing, rinsing, drying)?: A Little Help from another person to put on and taking off regular upper body clothing?: None Help from another person to put on and taking off regular lower body clothing?: A Little 6 Click Score: 20   End of Session Equipment Utilized During Treatment: Gait belt Nurse Communication: Mobility status (pt needs recliner in his room)  Activity Tolerance: Patient tolerated treatment well Patient left: in bed;with call bell/phone within reach;with bed alarm set  OT Visit Diagnosis: Hemiplegia and hemiparesis Hemiplegia - Right/Left: Left Hemiplegia - dominant/non-dominant: Non-Dominant Hemiplegia - caused by: Cerebral infarction                Time: 1426-1446 OT Time Calculation (min): 20 min Charges:  OT General Charges $OT Visit: 1 Visit OT Evaluation $OT Eval Low Complexity: 1 Low  Luis Brewer A Osha Rane 12/21/2021,  3:06 PM

## 2021-12-21 NOTE — ED Notes (Signed)
EDP [facility]  notified of the pts radiology results

## 2021-12-21 NOTE — Assessment & Plan Note (Signed)
Continuing home regimen of daily PPI therapy.  

## 2021-12-21 NOTE — Progress Notes (Signed)
°  Transition of Care [facility]) Screening Note   Patient Details  Name: Michell Kader Date of Birth: 1946-11-05   Transition of Care [facility]) CM/SW Contact:    Pollie Friar, RN Phone Number: 12/21/2021, 4:11 PM    Transition of Care Department [facility]) has reviewed patient. We will continue to monitor patient advancement through interdisciplinary progression rounds. If new patient transition needs arise, please place a TOC consult.

## 2021-12-21 NOTE — Assessment & Plan Note (Signed)
·   Permissive hypertension  As needed intravenous antihypertensives only for markedly elevated blood pressures of greater than 220/115

## 2021-12-21 NOTE — ED Provider Notes (Signed)
[facility] EMERGENCY DEPARTMENT Provider Note   CSN: [id number] Arrival date & time: 12/20/21  2247     History  Chief Complaint  Patient presents with   Alcohol Intoxication   Weakness    Luis Brewer is a 76 y.o. male.  HPI     This is a 76 year old male with history of diabetes, hypertension, sarcoma who presents with left arm and hand weakness.  Patient reports 3 to 5 days of increasing weakness in the left hand.  He is right-handed.  He states he has had difficulty gripping with the left hand specifically.  He states he has been dropping things.  He has not noted any gait disturbance.  No known history of stroke.  He is a daily drinker and last drank earlier today.  His friends called when they noted weakness.  Per EMS, he was also noted to have some slurred speech.  Patient reports that he used to take Coumadin and thinks he may have had a history of blood clots but is unsure.  Home Medications Prior to Admission medications   Medication Sig Start Date End Date Taking? Authorizing Provider  amLODipine (NORVASC) 5 MG tablet Take 1 tablet (5 mg total) by mouth daily. 1 tab every AM for Blood Pressure Patient not taking: Reported on 06/25/2019 08/09/16 08/09/17  Mellody Dance, DO  atorvastatin (LIPITOR) 20 MG tablet Take 1 tablet (20 mg total) by mouth at bedtime. Patient not taking: Reported on 06/25/2019 08/09/16   Mellody Dance, DO  buPROPion (WELLBUTRIN XL) 150 MG 24 hr tablet Take 1 tablet (150 mg total) by mouth daily. For mood/quitting smok/drink Patient not taking: Reported on 06/25/2019 08/09/16   Mellody Dance, DO  metFORMIN (GLUCOPHAGE XR) 500 MG 24 hr tablet Take 1 tablet (500 mg total) by mouth daily with breakfast. Patient not taking: Reported on 06/25/2019 08/09/16 08/09/17  Mellody Dance, DO      Allergies    Lisinopril    Review of Systems   Review of Systems  Respiratory:  Negative for chest tightness.   Cardiovascular:  Negative for  chest pain.  Neurological:  Positive for weakness and numbness. Negative for headaches.  All other systems reviewed and are negative.  Physical Exam Updated Vital Signs BP 137/84 (BP Location: Right Arm)    Pulse 94    Temp 98.2 F (36.8 C) (Oral)    Resp 14    SpO2 100%  Physical Exam Vitals and nursing note reviewed.  Constitutional:      Appearance: He is well-developed. He is not ill-appearing.  HENT:     Head: Normocephalic and atraumatic.     Nose: Nose normal.     Mouth/Throat:     Mouth: Mucous membranes are dry.  Eyes:     Pupils: Pupils are equal, round, and reactive to light.  Cardiovascular:     Rate and Rhythm: Normal rate and regular rhythm.     Heart sounds: Normal heart sounds. No murmur heard. Pulmonary:     Effort: Pulmonary effort is normal. No respiratory distress.     Breath sounds: Normal breath sounds. No wheezing.  Abdominal:     Palpations: Abdomen is soft.     Tenderness: There is no abdominal tenderness.  Musculoskeletal:     Cervical back: Neck supple.     Right lower leg: No edema.     Left lower leg: No edema.  Lymphadenopathy:     Cervical: No cervical adenopathy.  Skin:    General:  Skin is warm and dry.  Neurological:     Mental Status: He is alert and oriented to person, place, and time.     Comments: Cranial nerves II through XII intact, no facial droop noted, 4+ out of 5 grip, biceps, triceps, deltoid strength on the left, 5 out of 5 on the right, slight drift noted left lower extremity with diminished patellar reflexes, otherwise normal strength  Psychiatric:        Mood and Affect: Mood normal.    ED Results / Procedures / Treatments   Labs (all labs ordered are listed, but only abnormal results are displayed) Labs Reviewed  ETHANOL - Abnormal; Notable for the following components:      Result Value   Alcohol, Ethyl (B) 124 (*)    All other components within normal limits  COMPREHENSIVE METABOLIC PANEL - Abnormal; Notable for the  following components:   Sodium 131 (*)    CO2 21 (*)    Glucose, Bld 109 (*)    All other components within normal limits  I-STAT CHEM 8, ED - Abnormal; Notable for the following components:   Creatinine, Ser 1.30 (*)    Glucose, Bld 100 (*)    All other components within normal limits  RESP PANEL BY RT-PCR (FLU A&B, COVID) ARPGX2  PROTIME-INR  APTT  CBC  DIFFERENTIAL  RAPID URINE DRUG SCREEN, HOSP PERFORMED  URINALYSIS, ROUTINE W REFLEX MICROSCOPIC    EKG EKG Interpretation  Date/Time:  Tuesday December 20 2021 23:14:44 EST Ventricular Rate:  63 PR Interval:  156 QRS Duration: 90 QT Interval:  454 QTC Calculation: 464 R Axis:   107 Text Interpretation: Normal sinus rhythm with sinus arrhythmia Rightward axis Anterior infarct , age undetermined Abnormal ECG No previous ECGs available No prior for comparison Confirmed by Thayer Jew [phone]) on 12/20/2021 11:19:00 PM  Radiology CT HEAD WO CONTRAST (5MM)  Result Date: 12/21/2021 CLINICAL DATA:  Evaluate for acute stroke. Left-sided weakness and facial droop. Slurred speech. EXAM: CT HEAD WITHOUT CONTRAST TECHNIQUE: Contiguous axial images were obtained from the base of the skull through the vertex without intravenous contrast. RADIATION DOSE REDUCTION: This exam was performed according to the departmental dose-optimization program which includes automated exposure control, adjustment of the mA and/or kV according to patient size and/or use of iterative reconstruction technique. COMPARISON:  CT head 06/25/2019. FINDINGS: Brain: There is small acute cortical infarct in the high right frontal parietal lobe. There is hypodensity with loss of gray-white matter distinction. There is sulcal effacement. There is no acute intracranial hemorrhage, significant mass effect or midline shift. Mild diffuse atrophy is unchanged. No hydrocephalus. No extra-axial fluid collection. Vascular: Atherosclerotic calcifications are present within the  cavernous internal carotid arteries. Skull: Normal. Negative for fracture or focal lesion. Sinuses/Orbits: No acute finding. Other: None. IMPRESSION: 1. 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 No mass effect or midline shift. No acute intracranial hemorrhage. These results were called by telephone at the time of interpretation on 12/21/2021 at 12:10 am to provider Thereasa Solo, who verbally acknowledged these results. Electronically Signed   By: Ronney Asters M.D.   On: 12/21/2021 00:12    Procedures .Critical Care Performed by: Merryl Hacker, MD Authorized by: Merryl Hacker, MD   Critical care provider statement:    Critical care time (minutes):  30   Critical care was necessary to treat or prevent imminent or life-threatening deterioration of the following conditions:  CNS failure or compromise (Alcohol withdrawal)  Critical care was time spent personally by me on the following activities:  Development of treatment plan with patient or surrogate, discussions with consultants, evaluation of patient's response to treatment, examination of patient, ordering and review of laboratory studies, ordering and review of radiographic studies, ordering and performing treatments and interventions, pulse oximetry, re-evaluation of patient's condition and review of old charts    Medications Ordered in ED Medications  LORazepam (ATIVAN) injection 0-4 mg (has no administration in time range)    Or  LORazepam (ATIVAN) tablet 0-4 mg (has no administration in time range)  LORazepam (ATIVAN) injection 0-4 mg (has no administration in time range)    Or  LORazepam (ATIVAN) tablet 0-4 mg (has no administration in time range)  thiamine tablet 100 mg (has no administration in time range)    Or  thiamine (B-1) injection 100 mg (has no administration in time range)    ED Course/ Medical Decision Making/ A&P                           Medical Decision Making Risk OTC  drugs. Prescription drug management. Decision regarding hospitalization.   This patient presents to the ED for concern of left-sided weakness, this involves an extensive number of treatment options, and is a complaint that carries with it a high risk of complications and morbidity.  The differential diagnosis includes CVA, intoxication, metabolic derangement  MDM:    This is a 76 year old male with history of alcohol abuse who presents with left-sided weakness.  He is nontoxic.  Vital signs are reassuring.  Has signs of weakness in the left hand and left lower extremity.  Left lower extremity is more subtle.  He is greater than 24 hours out from onset of symptoms.  Does not meet criteria for code stroke or tPA.  However, given physical exam, would have high suspicion for recent CVA.  Labs ordered.  Patient sent for CT scan of the head.  CT scan of the head shows no evidence of acute bleed but does show evidence of an acute infarct.  Additionally, labs obtained.  Blood alcohol level 174.  Patient was placed on CIWA protocol given his increased risk for withdrawal.  No history of alcohol withdrawal seizures.  Neurology was consulted.  They will evaluate the patient.  We will plan for admission for stroke work-up. (Labs, imaging)  Labs: I Ordered, and personally interpreted labs.  The pertinent results include: Alcohol 174  Imaging Studies ordered: I ordered imaging studies including CT head I independently visualized and interpreted imaging. I agree with the radiologist interpretation  Additional history obtained from family at bedside.  External records from outside source obtained and reviewed including prior visits  Critical Interventions: CIWA protocol, Ativan  Consultations: I requested consultation with the neurology,  and discussed lab and imaging findings as well as pertinent plan - they recommend: Admission for stroke work-up  Cardiac Monitoring: The patient was maintained on a  cardiac monitor.  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   DEPRESSION 10/05/2009   DIABETES MELLITUS, TYPE II 10/05/2009   GERD 10/05/2009   HYPERTENSION 10/05/2009   Radiation 12/12/2012-02/11/2013   left thigh 6300 cGy in 35 sessions     Medicines Meds ordered this encounter  Medications   OR Linked Order Group    LORazepam (ATIVAN) injection 0-4 mg     Order Specific Question:   CIWA-AR < 5 =     Answer:   0 mg     Order Specific Question:   CIWA-AR 5 -10 =     Answer:   1 mg     Order Specific Question:   CIWA-AR 11 -15 =     Answer:   2 mg     Order Specific Question:   CIWA-AR 16 -24 =     Answer:   2 mg     Order Specific Question:   CIWA-AR 16 -24 =     Answer:   Recheck CIWA-AR in 1 hour; if > 15 notify MD     Order Specific Question:   CIWA-AR > 24 =     Answer:   4 mg     Order Specific Question:   CIWA-AR > 24 =     Answer:   Call Rapid Response    LORazepam (ATIVAN) tablet 0-4 mg     Order Specific Question:   CIWA-AR < 5 =     Answer:   0 mg     Order Specific Question:   CIWA-AR 5 -10 =     Answer:   1 mg     Order Specific Question:   CIWA-AR 11 -15 =     Answer:   2 mg     Order Specific Question:   CIWA-AR 16 -24 =     Answer:   2 mg     Order Specific Question:   CIWA-AR 16 -24 =     Answer:   Recheck CIWA-AR in 1 hour; if > 15 notify MD     Order Specific Question:   CIWA-AR > 24 =     Answer:   4 mg     Order Specific Question:   CIWA-AR > 24 =     Answer:   Call Rapid  Response   OR Linked Order Group    LORazepam (ATIVAN) injection 0-4 mg     Order Specific Question:   CIWA-AR < 5 =     Answer:   0 mg     Order Specific Question:   CIWA-AR 5 -10 =     Answer:   1 mg     Order Specific Question:   CIWA-AR 11 -15 =     Answer:   2 mg     Order Specific Question:   CIWA-AR 16 -24 =     Answer:   2 mg     Order Specific Question:   CIWA-AR 16 -24 =     Answer:   Recheck CIWA-AR in 1 hour; if > 15 notify MD     Order Specific Question:   CIWA-AR > 24 =     Answer:   4 mg     Order Specific Question:   CIWA-AR > 24 =     Answer:   Call Rapid Response    LORazepam (ATIVAN) tablet 0-4 mg     Order Specific Question:   CIWA-AR < 5 =     Answer:   0 mg  Order Specific Question:   CIWA-AR 5 -10 =     Answer:   1 mg     Order Specific Question:   CIWA-AR 11 -15 =     Answer:   2 mg     Order Specific Question:   CIWA-AR 16 -24 =     Answer:   2 mg     Order Specific Question:   CIWA-AR 16 -24 =     Answer:   Recheck CIWA-AR in 1 hour; if > 15 notify MD     Order Specific Question:   CIWA-AR > 24 =     Answer:   4 mg     Order Specific Question:   CIWA-AR > 24 =     Answer:   Call Rapid Response   OR Linked Order Group    thiamine tablet 100 mg    thiamine (B-1) injection 100 mg    I have reviewed the patients home medicines and have made adjustments as needed  Problem List / ED Course: Problem List Items Addressed This Visit       Other   Alcohol abuse (Chronic)   Other Visit Diagnoses     Cerebrovascular accident (CVA), unspecified mechanism ([place])    -  Primary                   Final Clinical Impression(s) / ED Diagnoses Final diagnoses:  Cerebrovascular accident (CVA), unspecified mechanism ([place])  Alcohol abuse    Rx / DC Orders ED Discharge Orders     None         Nykolas Bacallao, Barbette Hair, MD 12/21/21 [phone]

## 2021-12-21 NOTE — Assessment & Plan Note (Signed)
·   ED Imaging reveals acute cortical infarct in the right frontal lobe on CT imaging  Patient continues to experience ongoing left upper extremity weakness and numbness.  Performing serial neurologic checks  Monitoring patient on telemetry  Initiating antiplatelet therapy including aspirin  Daily statin therapy will be initiated if LDL is greater than 70  Further imaging to include: MRI of the brain without contrast, MRI of the head  Obtaining hemoglobin A1c and lipid panel in the morning  Echocardiogram in the morning  PT, OT, SLP evaluation  Permissive hypertension with as needed antihypertensives only to be given if blood pressure greater than 220/115  Neurology following in consultation.

## 2021-12-21 NOTE — Progress Notes (Signed)
Patient arrived to the bed via stretcher; walked to the bed with stand by assist. Patient is alert and oriented X4. Patient oriented to the room. Patient's wallet placed in a patient belonigngs bag and placed in the closet; patient refused to send it to security. Patient made aware he is responsible for the wallet. Will release orders.

## 2021-12-21 NOTE — Progress Notes (Addendum)
STROKE TEAM PROGRESS NOTE   INTERVAL HISTORY Patient is seen in the ED with no family at the bedside.  He reports that six days ago, he noticed some weakness of his left hand and began dropping objects.  He initially thought it would improve on its own, but a friend convinced him to go to the ED.  He was found to have a small frontal lobe stroke on CT. MRI scan of the brain shows scattered right MCA infarcts with chronic posterior right MCA infarct as well.  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  CT angiogram shows right ICA occlusion close to the bulb with some distal reconstitution of the supraclinoid portion with flow in the right MCA but of diminutive caliber. Vitals:   12/21/21 0641 12/21/21 1023 12/21/21 1139 12/21/21 1225  BP: (!) 174/75 (!) 157/71 (!) 157/71 113/77  Pulse: 61 60 60 (!) 54  Resp: 19 16  16   Temp:    98 F (36.7 C)  TempSrc:    Oral  SpO2: 94% 92%     CBC:  Recent Labs  Lab 12/20/21 2313 12/20/21 2324 12/21/21 0331  WBC 7.0  --  6.1  NEUTROABS 3.3  --  2.9  HGB 14.5 15.6 13.7  HCT 43.6 46.0 39.6  MCV 96.5  --  97.1  PLT 330  --  761   Basic Metabolic Panel:  Recent Labs  Lab 12/20/21 2313 12/20/21 2324 12/21/21 0331  NA 131* 135 133*  K 4.6 4.3 5.4*  CL 100 101 105  CO2 21*  --  18*  GLUCOSE 109* 100* 94  BUN 16 18 15   CREATININE 1.17 1.30* 1.07  CALCIUM 9.2  --  8.7*  MG  --   --  1.9   Lipid Panel:  Recent Labs  Lab 12/21/21 0331  CHOL 143  TRIG 63  HDL 38*  CHOLHDL 3.8  VLDL 13  LDLCALC 92   HgbA1c:  Recent Labs  Lab 12/21/21 0332  HGBA1C 6.7*   Urine Drug Screen:  Recent Labs  Lab 12/20/21 2303  LABOPIA NONE DETECTED  COCAINSCRNUR NONE DETECTED  LABBENZ NONE DETECTED  AMPHETMU NONE DETECTED  THCU NONE DETECTED  LABBARB NONE DETECTED    Alcohol Level  Recent Labs  Lab 12/20/21 2313  ETH 124*    IMAGING past 24 hours CT ANGIO HEAD NECK W WO  CM  Result Date: 12/21/2021 CLINICAL DATA:  76 year old male right ICA occlusion and scattered right MCA infarcts on MRI/MRA today. EXAM: CT ANGIOGRAPHY HEAD AND NECK TECHNIQUE: Multidetector CT imaging of the head and neck was performed using the standard protocol during bolus administration of intravenous contrast. Multiplanar CT image reconstructions and MIPs were obtained to evaluate the vascular anatomy. Carotid stenosis measurements (when applicable) are obtained utilizing NASCET criteria, using the distal internal carotid diameter as the denominator. RADIATION DOSE REDUCTION: This exam was performed according to the departmental dose-optimization program which includes automated exposure control, adjustment of the mA and/or kV according to patient size and/or use of iterative reconstruction technique. CONTRAST:  25mL OMNIPAQUE IOHEXOL 350 MG/ML SOLN COMPARISON:  Brain MRI and MRA earlier today. Plain head CT 0005 hours today. FINDINGS: CTA NECK Skeleton: Absent maxillary dentition. Bulky cervical spine disc and endplate degeneration. No acute osseous abnormality identified. Upper chest: Bullous emphysema right lung apex. Moderate to severe centrilobular emphysema elsewhere in the visible upper lobes. Visible trachea is clear, negative. No superior mediastinal  lymphadenopathy. Other neck: Negative. Stable visible brain parenchyma from earlier today. Aortic arch: 3 vessel arch configuration. Moderate calcified arch atherosclerosis. Right carotid system: Brachiocephalic and right CCA origin plaque without stenosis. Mildly tortuous right CCA with widespread soft and calcified plaque but no stenosis proximal to the carotid bifurcation. Bulky calcified atherosclerosis at the right ICA origin. The right ICA bulb remains patent but contrast tapers distal to the bulb and the vessel is fully occluded by C2. No reconstitution to the skull base. Left carotid system: Widespread atherosclerosis but no hemodynamically  significant stenosis. Mildly tortuous cervical left ICA. Vertebral arteries: Proximal right subclavian artery atherosclerosis without stenosis. Atherosclerosis and severe stenosis of the right vertebral artery origin and V1 segment (series 8, image 185) but the vessel remains patent. Non dominant appearing right vertebral artery is otherwise patent to the skull base. Proximal left subclavian atherosclerosis without stenosis. Calcified plaque at the left vertebral artery origin but only mild stenosis on series 8, image 174. Dominant left vertebral artery is patent to the skull base without additional plaque or stenosis. CTA HEAD Posterior circulation: Dominant left vertebral artery is patent to the vertebrobasilar junction with mild irregularity and no stenosis. Non dominant right V4 segment with calcified plaque and mild stenosis proximal to the right PICA origin. The vessel remains patent to the vertebrobasilar junction. Left AICA appears dominant and patent. Patent basilar artery with mild irregularity, no significant stenosis. Patent SCA and PCA origins. Posterior communicating arteries are diminutive or absent. Mild to moderate irregularity of the bilateral PCA branches, with moderate stenosis at the left P1/P2 and P2/P3 junctions (series 12, image 24). Distal PCA branches remain patent. Anterior circulation: No enhancement of the right ICA siphon until the level of the ophthalmic artery. Supraclinoid segment is patent with moderate calcified plaque and mild stenosis. Right MCA origin and M1 segment are patent (series 11, image 20). Right MCA bifurcation is patent. Right MCA branches are diminutive and/or absent compared to the left side. The right ACA A1 segment is probably diminutive or absent rather than occluded. Normal anterior communicating artery. ACA branches are within normal limits. Left ICA siphon is patent with moderate calcified plaque. And up to moderate supraclinoid segment stenosis (series 7,  image 124). Patent left ICA terminus. Patent left MCA and ACA origins. Left ACA A1 segment irregularity and stenosis is mild by CTA (series 11, image 19). Left MCA M1 segment and bifurcation are patent without stenosis. Left MCA branches are within normal limits. Venous sinuses: Early contrast timing, not well evaluated. Anatomic variants: Dominant left vertebral artery. Dominant left and diminutive or absent right ACA A1 segments. Review of the MIP images confirms the above findings IMPRESSION: 1. 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 Supraclinoid ICA reconstituted from the ophthalmic artery. Right MCA M1 is patent but diminutive with an intermediate level of reconstituted right MCA branches. 2. No other large vessel occlusion. Dominant left ACA and anterior communicating arteries supply the right ACA. With left ACA A1 stenosis mild by CTA. 3. Other atherosclerosis in the head and neck: Up to moderate Left ICA supraclinoid segment stenosis. Moderate to severe origin stenosis of the non dominant right vertebral artery due to plaque. Moderate tandem stenoses of the Left PCA. 4. Aortic Atherosclerosis (ICD10-I70.0) and Emphysema (ICD10-J43.9). Electronically Signed   By: Genevie Ann M.D.   On: 12/21/2021 07:29   CT HEAD WO CONTRAST (5MM)  Result Date: 12/21/2021 CLINICAL DATA:  Evaluate for acute stroke. Left-sided weakness and  facial droop. Slurred speech. EXAM: CT HEAD WITHOUT CONTRAST TECHNIQUE: Contiguous axial images were obtained from the base of the skull through the vertex without intravenous contrast. RADIATION DOSE REDUCTION: This exam was performed according to the departmental dose-optimization program which includes automated exposure control, adjustment of the mA and/or kV according to patient size and/or use of iterative reconstruction technique. COMPARISON:  CT head 06/25/2019. FINDINGS: Brain: There is small acute cortical infarct in the high right frontal parietal lobe.  There is hypodensity with loss of gray-white matter distinction. There is sulcal effacement. There is no acute intracranial hemorrhage, significant mass effect or midline shift. Mild diffuse atrophy is unchanged. No hydrocephalus. No extra-axial fluid collection. Vascular: Atherosclerotic calcifications are present within the cavernous internal carotid arteries. Skull: Normal. Negative for fracture or focal lesion. Sinuses/Orbits: No acute finding. Other: None. IMPRESSION: 1. 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 No mass effect or midline shift. No acute intracranial hemorrhage. These results were called by telephone at the time of interpretation on 12/21/2021 at 12:10 am to provider Thereasa Solo, who verbally acknowledged these results. Electronically Signed   By: Ronney Asters M.D.   On: 12/21/2021 00:12   MR ANGIO HEAD WO CONTRAST  Addendum Date: 12/21/2021   ADDENDUM REPORT: 12/21/2021 06:30 ADDENDUM: Study discussed by telephone with Dr. Inda Merlin on 12/21/2021 at 0625 hours. Electronically Signed   By: Genevie Ann M.D.   On: 12/21/2021 06:30   Result Date: 12/21/2021 CLINICAL DATA:  76 year old male left side weakness, slurred speech and facial droop. EXAM: MRI HEAD WITHOUT CONTRAST MRA HEAD WITHOUT CONTRAST TECHNIQUE: Multiplanar, multi-echo pulse sequences of the brain and surrounding structures were acquired without intravenous contrast. Angiographic images of the Circle of Willis were acquired using MRA technique without intravenous contrast. COMPARISON:  Head CT 0005 hours today. FINDINGS: MRI HEAD FINDINGS Brain: Widely scattered small areas of cortical, subcortical, and occasionally central white matter restricted diffusion in the right hemisphere primarily corresponding to the right MCA territory. Right middle and superior gyri posteriorly most affected, with associated T2 and FLAIR hyperintense cytotoxic edema. But superimposed area of chronic encephalomalacia in the  superior right parietal lobe which seems to correspond to the dominant CT finding earlier today (series 6, image 45). Possible petechial hemorrhage (series 15, image 44) there. No definite acute or malignant hemorrhagic transformation. No significant intracranial mass effect. No contralateral left hemisphere or posterior fossa restricted diffusion. No other chronic cerebral blood products identified. Mild ventricular prominence appears to be ex vacuo related. And there is also mild encephalomalacia along the central cerebellar vermis (series 11, image 8). No midline shift, mass effect, evidence of mass lesion. Cervicomedullary junction and pituitary are within normal limits. Vascular: Absent right ICA flow void in the neck and through most of the right ICA siphon (series 10, image 9). Evidence of some reconstituted flow in the right anterior circulation branches. Other Major intracranial vascular flow voids are preserved. Skull and upper cervical spine: Cervical spine disc and endplate degeneration. Visualized bone marrow signal is within normal limits. Sinuses/Orbits: Postoperative changes to both globes. Scattered mild to moderate paranasal sinus mucosal thickening. Other: Trace bilateral mastoid air cell fluid. Negative visible nasopharynx. MRA HEAD FINDINGS Study is intermittently degraded by motion artifact despite repeated imaging attempts. Anterior circulation: Absent antegrade flow right ICA siphon. Preserved anterior communicating artery flow which appears to supply the right ACA. Poor flow signal at the right MCA origin and and right MCA branches. Antegrade flow in the  left ICA siphon with mild siphon irregularity and stenosis. Patent left ICA terminus. Patent left MCA and ACA origins. M but there is a moderate stenosis of the distal ild stenosis left ACA A1 segment. Left MCA M1 segment and left MCA bifurcation are patent without stenosis. Visible left MCA branches are within normal limits. Posterior  circulation: Antegrade flow in the posterior circulation with mildly dominant left vertebral artery. Patent right PICA and left AICA origins. No distal vertebral or proximal basilar artery stenosis. But there is mild to moderate distal basilar irregularity. Only mild distal basilar stenosis. SCA and PCA origins are patent with mild irregularity. Bilateral PCA branches are patent with mild to moderate irregularity greater on the left. Anatomic variants: Mildly dominant left vertebral artery. IMPRESSION: 1. Positive for Right ICA Occlusion in the neck and at the skull base. Associated widely scattered small mostly cortical and subcortical Right MCA territory infarcts with no associated hemorrhage or mass effect. Underlying chronic infarct in the posterior right MCA territory (superior parietal lobe) with hemosiderin. 2. Right ACA collateralized by the anterior communicating artery. But there is a moderate stenosis of the Left ACA A1 segment. 3. Other intracranial atherosclerosis on MRA with mild stenoses of the Basilar Artery, bilateral PCA branches. Electronically Signed: By: Genevie Ann M.D. On: 12/21/2021 06:09   MR BRAIN WO CONTRAST  Addendum Date: 12/21/2021   ADDENDUM REPORT: 12/21/2021 06:30 ADDENDUM: Study discussed by telephone with Dr. Inda Merlin on 12/21/2021 at 0625 hours. Electronically Signed   By: Genevie Ann M.D.   On: 12/21/2021 06:30   Result Date: 12/21/2021 CLINICAL DATA:  76 year old male left side weakness, slurred speech and facial droop. EXAM: MRI HEAD WITHOUT CONTRAST MRA HEAD WITHOUT CONTRAST TECHNIQUE: Multiplanar, multi-echo pulse sequences of the brain and surrounding structures were acquired without intravenous contrast. Angiographic images of the Circle of Willis were acquired using MRA technique without intravenous contrast. COMPARISON:  Head CT 0005 hours today. FINDINGS: MRI HEAD FINDINGS Brain: Widely scattered small areas of cortical, subcortical, and occasionally central white  matter restricted diffusion in the right hemisphere primarily corresponding to the right MCA territory. Right middle and superior gyri posteriorly most affected, with associated T2 and FLAIR hyperintense cytotoxic edema. But superimposed area of chronic encephalomalacia in the superior right parietal lobe which seems to correspond to the dominant CT finding earlier today (series 6, image 45). Possible petechial hemorrhage (series 15, image 44) there. No definite acute or malignant hemorrhagic transformation. No significant intracranial mass effect. No contralateral left hemisphere or posterior fossa restricted diffusion. No other chronic cerebral blood products identified. Mild ventricular prominence appears to be ex vacuo related. And there is also mild encephalomalacia along the central cerebellar vermis (series 11, image 8). No midline shift, mass effect, evidence of mass lesion. Cervicomedullary junction and pituitary are within normal limits. Vascular: Absent right ICA flow void in the neck and through most of the right ICA siphon (series 10, image 9). Evidence of some reconstituted flow in the right anterior circulation branches. Other Major intracranial vascular flow voids are preserved. Skull and upper cervical spine: Cervical spine disc and endplate degeneration. Visualized bone marrow signal is within normal limits. Sinuses/Orbits: Postoperative changes to both globes. Scattered mild to moderate paranasal sinus mucosal thickening. Other: Trace bilateral mastoid air cell fluid. Negative visible nasopharynx. MRA HEAD FINDINGS Study is intermittently degraded by motion artifact despite repeated imaging attempts. Anterior circulation: Absent antegrade flow right ICA siphon. Preserved anterior communicating artery flow which appears to supply the right  ACA. Poor flow signal at the right MCA origin and and right MCA branches. Antegrade flow in the left ICA siphon with mild siphon irregularity and stenosis.  Patent left ICA terminus. Patent left MCA and ACA origins. M but there is a moderate stenosis of the distal ild stenosis left ACA A1 segment. Left MCA M1 segment and left MCA bifurcation are patent without stenosis. Visible left MCA branches are within normal limits. Posterior circulation: Antegrade flow in the posterior circulation with mildly dominant left vertebral artery. Patent right PICA and left AICA origins. No distal vertebral or proximal basilar artery stenosis. But there is mild to moderate distal basilar irregularity. Only mild distal basilar stenosis. SCA and PCA origins are patent with mild irregularity. Bilateral PCA branches are patent with mild to moderate irregularity greater on the left. Anatomic variants: Mildly dominant left vertebral artery. IMPRESSION: 1. Positive for Right ICA Occlusion in the neck and at the skull base. Associated widely scattered small mostly cortical and subcortical Right MCA territory infarcts with no associated hemorrhage or mass effect. Underlying chronic infarct in the posterior right MCA territory (superior parietal lobe) with hemosiderin. 2. Right ACA collateralized by the anterior communicating artery. But there is a moderate stenosis of the Left ACA A1 segment. 3. Other intracranial atherosclerosis on MRA with mild stenoses of the Basilar Artery, bilateral PCA branches. Electronically Signed: By: Genevie Ann M.D. On: 12/21/2021 06:09    PHYSICAL EXAM General: Alert, well-developed, well-nourished African-American male in no acute distress   NEURO:  Mental Status: AA&Ox3  Speech/Language: speech is without dysarthria or aphasia.  Naming, repetition, fluency, and comprehension intact.  Cranial Nerves:  II: PERRL. Visual fields full.  III, IV, VI: EOMI. Eyelids elevate symmetrically.  V: Sensation is intact to light touch and symmetrical to face.  VII: Smile is symmetrical.  VIII: hearing intact to voice. IX, X: Phonation is normal.  XII: tongue is  midline without fasciculations. Motor: 5/5 strength to RUE, RLE and LLE, LUE strength 4/5 with left grip weakness.  Intrinsic hand muscles are weak on the left.  Orbits right over left upper extremity. Tone: is normal and bulk is normal Sensation- Intact to light touch bilaterally.  Coordination: FTN intact on right, slight dysmetria on left, slight right arm drift Gait- deferred   ASSESSMENT/PLAN Mr. Luis Brewer is a 76 y.o. male with history of ETOH use, smoking, DM2, GERD, HTN, depression and sarcoma of the left thigh presenting with weakness of his left hand which began six days ago.  He initially thought it would improve on its own, but a friend convinced him to go to the ED.  He was found to have a small frontal lobe stroke on CT.  Stroke:  right middle cerebral artery i branch nfarct likely secondary due to occlusion of proximal right ICA from large vessel disease CT head Small acute cortical infarct in high right frontoparietal lobe CTA head & neck Right ICA occlusion in neck distal to bulb, moderate left ICA stenosis MRI  scattered small areas of restricted diffusion in right MCA territory, chronic infarct in posterior right MCA territory MRA  Right ICA occlusion in the neck 2D Echo pending LDL 92 HgbA1c 6.7 VTE prophylaxis - lovenox    Diet   Diet NPO time specified   No antithrombotic prior to admission, now on aspirin 81 mg daily and clopidogrel 75 mg daily. x3 months followed by aspirin alone Therapy recommendations:  pending Disposition:  pending  Hypertension Home meds:  none Stable  Permissive hypertension (OK if < 220/120) but gradually normalize in 5-7 days Long-term BP goal normotensive  Hyperlipidemia Home meds:  none LDL 92, goal < 70 Add atorvastatin 40 mg daily  Continue statin at discharge  Diabetes type II Controlled Home meds:  none HgbA1c 6.7, goal < 7.0 CBGs Recent Labs    12/21/21 0809  GLUCAP 88    SSI  Other Stroke Risk  Factors Advanced Age >/= 49  Cigarette smokeradvised to stop smoking ETOH use, alcohol level 124, advised to drink no more than 2 drink(s) a day  Other Active Problems ETOH abuse CIWA protocol with lorazepam  Hospital day # [place]ec[place] , MSN, AGACNP-BC [facility] See Amion for schedule and pager information 12/21/2021 12:44 PM  I have personally obtained history,examined this patient, reviewed notes, independently viewed imaging studies, participated in medical decision making and plan of care.ROS completed by me personally and pertinent positives fully documented  I have made any additions or clarifications directly to the above note. Agree with note above.  Patient presented with 1 week history of hand weakness secondary to right MCA branch infarct likely from proximal right internal carotid artery occlusion.  Recommend dual antiplatelet therapy for 3 months followed by aspirin alone and aggressive risk factor modification.  Patient counseled to quit smoking and states he is willing.  Also counseled to limit alcohol intake to no more than 1-2 drinks per day.  Greater than 50% time during this 50-minute visit was spent in counseling and coordination of care and discussion with patient and care team answering questions.  Antony Contras, MD Medical Director [facility] Pager: [phone] 12/21/2021 2:44 PM   To contact Stroke Continuity provider, please refer to [email or web address] After hours, contact General Neurology
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
## 2021-12-22 ENCOUNTER — Encounter (HOSPITAL_COMMUNITY): Payer: Medicare (Managed Care)

## 2021-12-22 DIAGNOSIS — I63521 Cerebral infarction due to unspecified occlusion or stenosis of right anterior cerebral artery: Secondary | ICD-10-CM | POA: Diagnosis not present

## 2021-12-22 DIAGNOSIS — I10 Essential (primary) hypertension: Secondary | ICD-10-CM | POA: Diagnosis not present

## 2021-12-22 DIAGNOSIS — K219 Gastro-esophageal reflux disease without esophagitis: Secondary | ICD-10-CM | POA: Diagnosis not present

## 2021-12-22 DIAGNOSIS — F1010 Alcohol abuse, uncomplicated: Secondary | ICD-10-CM | POA: Diagnosis not present

## 2021-12-22 LAB — GLUCOSE, CAPILLARY
Glucose-Capillary: 99 mg/dL (ref 70–99)
Glucose-Capillary: 156 mg/dL — ABNORMAL HIGH (ref 70–99)
Glucose-Capillary: 191 mg/dL — ABNORMAL HIGH (ref 70–99)

## 2021-12-22 LAB — COMPREHENSIVE METABOLIC PANEL
ALT: 25 U/L (ref 0–44)
AST: 31 U/L (ref 15–41)
Albumin: 3.6 g/dL (ref 3.5–5.0)
Alkaline Phosphatase: 43 U/L (ref 38–126)
Anion gap: 10 (ref 5–15)
BUN: 18 mg/dL (ref 8–23)
CO2: 23 mmol/L (ref 22–32)
Calcium: 9.4 mg/dL (ref 8.9–10.3)
Chloride: 102 mmol/L (ref 98–111)
Creatinine, Ser: 1.14 mg/dL (ref 0.61–1.24)
GFR, Estimated: >60 mL/min (ref >60)
Glucose, Bld: 66 mg/dL — ABNORMAL LOW (ref 70–99)
Potassium: 3.8 mmol/L (ref 3.5–5.1)
Sodium: 135 mmol/L (ref 135–145)
Total Bilirubin: 0.7 mg/dL (ref 0.3–1.2)
Total Protein: 7.2 g/dL (ref 6.5–8.1)

## 2021-12-22 MED ORDER — CLOPIDOGREL BISULFATE 75 MG PO TABS: 75.0000 mg | ORAL_TABLET | Freq: Every day | ORAL | 0 refills | Status: AC

## 2021-12-22 MED ORDER — CLOPIDOGREL BISULFATE 75 MG PO TABS: 75.0000 mg | ORAL_TABLET | Freq: Every day | ORAL | 0 refills | Status: DC

## 2021-12-22 MED ORDER — ATORVASTATIN CALCIUM 40 MG PO TABS: 40.0000 mg | ORAL_TABLET | Freq: Every day | ORAL | 0 refills | Status: AC

## 2021-12-22 MED ORDER — METFORMIN HCL 500 MG PO TABS: 500.0000 mg | ORAL_TABLET | Freq: Every day | ORAL | 0 refills | Status: AC

## 2021-12-22 MED ORDER — THIAMINE HCL 100 MG PO TABS: 100.0000 mg | ORAL_TABLET | Freq: Every day | ORAL | 0 refills | Status: AC

## 2021-12-22 MED ORDER — FOLIC ACID 1 MG PO TABS: 1.0000 mg | ORAL_TABLET | Freq: Every day | ORAL | 0 refills | Status: AC

## 2021-12-22 MED ORDER — ASPIRIN 81 MG PO CHEW: 81.0000 mg | CHEWABLE_TABLET | Freq: Every day | ORAL | 0 refills | Status: AC

## 2021-12-22 MED ORDER — ADULT MULTIVITAMIN W/MINERALS CH: 1.0000 | ORAL_TABLET | Freq: Every day | ORAL | Status: AC

## 2021-12-22 NOTE — Discharge Summary (Signed)
Physician Discharge Summary   Patient: Luis Brewer MRN: [id number] DOB: 04-12-1946  Admit date:     12/20/2021  Discharge date: 12/22/21  Discharge Physician: Alma Friendly   PCP: No primary care provider on file.   Recommendations at discharge:    Advised to establish care with a PCP for follow up-[facility] on 01/13/2022 Follow-up with outpatient stroke clinic in 3 months Follow-up with vascular surgery in 1 week   Discharge Diagnoses: Principal Problem:   Acute ischemic right ACA stroke ([place]) Active Problems:   Type 2 diabetes mellitus without complication, without long-term current use of insulin ([place])   Essential hypertension   GERD without esophagitis   Alcohol abuse   Mixed diabetic hyperlipidemia associated with type 2 diabetes mellitus [facility])     Hospital Course: 76 year old male with past medical history of alcohol abuse, DM type 2, HTN, HLD, GERD, 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 Patient reports that approximately 6 days ago he began to experience weakness in the left hand and noticed that he was dropping items, associated with numbness of the left upper extremity, some slurred speech as well. Upon evaluation in the emergency department a code stroke was not called and tPA was not administered due to patient being well outside of the tPA window.  CT imaging the head without contrast was initially done revealing a small acute cortical infarct in the right frontal parietal lobe.  Neurology consulted, patient admitted for further management.    Today, pt denies any new complaints. No worsening weakness, chest pain, SOB, headache, abdominal pain, fever/chills, N/V. Pt advised to follow up with a PCP.   Assessment and Plan:  Acute ischemic right stroke Right middle cerebral artery branch infarct likely secondary due to occlusion of  proximal right ICA from large vessel disease MRI brain/MRA angio showed right ICA occlusion in the neck and at the skull base, noted widely scattered small mostly cortical and subcortical Right MCA territory infarcts with no associated hemorrhage or mass effect. Underlying chronic infarct in the posterior right MCA territory Echo showed EF of 50 to 55%, no regional wall motion abnormalities, no atrial level shunt noted LDL 92, A1c 6.7 Neurology consulted, discharge on aspirin and Plavix for 3 months, then aspirin alone Continue aspirin, Plavix, Lipitor Plan for elective left carotid revascularization by vascular surgery as per neurology Follow-up with outpatient stroke clinic in 3 months Advised to quit/limit alcohol intake as well as quit smoking PT/OT/SLP- Recommend outpatient PT/SLP   Diabetes mellitus type 2 A1c 6.7 Given recent CVA, will start on metformin 500 mg daily Patient to follow up with PCP   Hyperlipidemia Continue statins as above   Hypertension Not on any meds at home BP fairly stable Follow up with PCP   GERD Continue PPI   Alcohol abuse Advised to quit MVT, thiamine, folic acid   Tobacco abuse Advised to quit Nicotine patch       Consultants: Neurology Procedures performed: None Disposition: Home Diet recommendation:  Cardiac and Carb modified diet  DISCHARGE MEDICATION: Allergies as of 12/22/2021       Reactions   Lisinopril Anaphylaxis   throat swelling        Medication List     TAKE these medications    acetaminophen 500 MG tablet Commonly known as: TYLENOL Take 1,000 mg by mouth every 6 (six) hours as needed for mild pain.  aspirin 81 MG chewable tablet Chew 1 tablet (81 mg total) by mouth daily. Start taking on: December 23, 2021   atorvastatin 40 MG tablet Commonly known as: LIPITOR Take 1 tablet (40 mg total) by mouth daily. Start taking on: December 23, 2021   buPROPion 150 MG 24 hr tablet Commonly known as: Wellbutrin  XL Take 1 tablet (150 mg total) by mouth daily. For mood/quitting smok/drink   clopidogrel 75 MG tablet Commonly known as: PLAVIX Take 1 tablet (75 mg total) by mouth daily. Start taking on: December 23, 8914   folic acid 1 MG tablet Commonly known as: FOLVITE Take 1 tablet (1 mg total) by mouth daily. Start taking on: December 23, 2021   metFORMIN 500 MG tablet Commonly known as: Glucophage Take 1 tablet (500 mg total) by mouth daily with breakfast.   multivitamin with minerals Tabs tablet Take 1 tablet by mouth daily. Start taking on: December 23, 2021   thiamine 100 MG tablet Take 1 tablet (100 mg total) by mouth daily. Start taking on: December 23, 2021        [facility] Follow up on 01/13/2022.   Why: Your appointment is at 1:20pm but the driver will pick you up at 1 pm for your appointment please take your medications and discharge paperwork from the hospital Contact information: [address], [place], [place] [address]  [phone]        [facility] Follow up.   Specialty: Neurology Why: Office will call you for an appointment, for stroke clinic Contact information: [place] [phone]                Discharge Exam: General: NAD  Cardiovascular: S1, S2 present Respiratory: CTAB Abdomen: Soft, nontender, nondistended, bowel sounds present Musculoskeletal: No bilateral pedal edema noted Skin: Normal Psychiatry: Normal mood  Neurology: Strength 5/5 RUE, RLE and LLE 4/5 on LUE  Condition at discharge: stable  The results of significant diagnostics from this hospitalization (including imaging, microbiology, ancillary and laboratory) are listed below for reference.   Imaging Studies: CT ANGIO HEAD NECK W WO CM  Result Date: 12/21/2021 CLINICAL DATA:  76 year old male right ICA occlusion and scattered right MCA infarcts on MRI/MRA today. EXAM: CT  ANGIOGRAPHY HEAD AND NECK TECHNIQUE: Multidetector CT imaging of the head and neck was performed using the standard protocol during bolus administration of intravenous contrast. Multiplanar CT image reconstructions and MIPs were obtained to evaluate the vascular anatomy. Carotid stenosis measurements (when applicable) are obtained utilizing NASCET criteria, using the distal internal carotid diameter as the denominator. RADIATION DOSE REDUCTION: This exam was performed according to the departmental dose-optimization program which includes automated exposure control, adjustment of the mA and/or kV according to patient size and/or use of iterative reconstruction technique. CONTRAST:  65mL OMNIPAQUE IOHEXOL 350 MG/ML SOLN COMPARISON:  Brain MRI and MRA earlier today. Plain head CT 0005 hours today. FINDINGS: CTA NECK Skeleton: Absent maxillary dentition. Bulky cervical spine disc and endplate degeneration. No acute osseous abnormality identified. Upper chest: Bullous emphysema right lung apex. Moderate to severe centrilobular emphysema elsewhere in the visible upper lobes. Visible trachea is clear, negative. No superior mediastinal lymphadenopathy. Other neck: Negative. Stable visible brain parenchyma from earlier today. Aortic arch: 3 vessel arch configuration. Moderate calcified arch atherosclerosis. Right carotid system: Brachiocephalic and right CCA origin plaque without stenosis. Mildly tortuous right CCA with widespread soft and calcified plaque but no stenosis  proximal to the carotid bifurcation. Bulky calcified atherosclerosis at the right ICA origin. The right ICA bulb remains patent but contrast tapers distal to the bulb and the vessel is fully occluded by C2. No reconstitution to the skull base. Left carotid system: Widespread atherosclerosis but no hemodynamically significant stenosis. Mildly tortuous cervical left ICA. Vertebral arteries: Proximal right subclavian artery atherosclerosis without stenosis.  Atherosclerosis and severe stenosis of the right vertebral artery origin and V1 segment (series 8, image 185) but the vessel remains patent. Non dominant appearing right vertebral artery is otherwise patent to the skull base. Proximal left subclavian atherosclerosis without stenosis. Calcified plaque at the left vertebral artery origin but only mild stenosis on series 8, image 174. Dominant left vertebral artery is patent to the skull base without additional plaque or stenosis. CTA HEAD Posterior circulation: Dominant left vertebral artery is patent to the vertebrobasilar junction with mild irregularity and no stenosis. Non dominant right V4 segment with calcified plaque and mild stenosis proximal to the right PICA origin. The vessel remains patent to the vertebrobasilar junction. Left AICA appears dominant and patent. Patent basilar artery with mild irregularity, no significant stenosis. Patent SCA and PCA origins. Posterior communicating arteries are diminutive or absent. Mild to moderate irregularity of the bilateral PCA branches, with moderate stenosis at the left P1/P2 and P2/P3 junctions (series 12, image 24). Distal PCA branches remain patent. Anterior circulation: No enhancement of the right ICA siphon until the level of the ophthalmic artery. Supraclinoid segment is patent with moderate calcified plaque and mild stenosis. Right MCA origin and M1 segment are patent (series 11, image 20). Right MCA bifurcation is patent. Right MCA branches are diminutive and/or absent compared to the left side. The right ACA A1 segment is probably diminutive or absent rather than occluded. Normal anterior communicating artery. ACA branches are within normal limits. Left ICA siphon is patent with moderate calcified plaque. And up to moderate supraclinoid segment stenosis (series 7, image 124). Patent left ICA terminus. Patent left MCA and ACA origins. Left ACA A1 segment irregularity and stenosis is mild by CTA (series 11,  image 19). Left MCA M1 segment and bifurcation are patent without stenosis. Left MCA branches are within normal limits. Venous sinuses: Early contrast timing, not well evaluated. Anatomic variants: Dominant left vertebral artery. Dominant left and diminutive or absent right ACA A1 segments. Review of the MIP images confirms the above findings IMPRESSION: 1. 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 Supraclinoid ICA reconstituted from the ophthalmic artery. Right MCA M1 is patent but diminutive with an intermediate level of reconstituted right MCA branches. 2. No other large vessel occlusion. Dominant left ACA and anterior communicating arteries supply the right ACA. With left ACA A1 stenosis mild by CTA. 3. Other atherosclerosis in the head and neck: Up to moderate Left ICA supraclinoid segment stenosis. Moderate to severe origin stenosis of the non dominant right vertebral artery due to plaque. Moderate tandem stenoses of the Left PCA. 4. Aortic Atherosclerosis (ICD10-I70.0) and Emphysema (ICD10-J43.9). Electronically Signed   By: Genevie Ann M.D.   On: 12/21/2021 07:29   CT HEAD WO CONTRAST (5MM)  Result Date: 12/21/2021 CLINICAL DATA:  Evaluate for acute stroke. Left-sided weakness and facial droop. Slurred speech. EXAM: CT HEAD WITHOUT CONTRAST TECHNIQUE: Contiguous axial images were obtained from the base of the skull through the vertex without intravenous contrast. RADIATION DOSE REDUCTION: This exam was performed according to the departmental dose-optimization program which includes automated exposure control,  adjustment of the mA and/or kV according to patient size and/or use of iterative reconstruction technique. COMPARISON:  CT head 06/25/2019. FINDINGS: Brain: There is small acute cortical infarct in the high right frontal parietal lobe. There is hypodensity with loss of gray-white matter distinction. There is sulcal effacement. There is no acute intracranial hemorrhage, significant  mass effect or midline shift. Mild diffuse atrophy is unchanged. No hydrocephalus. No extra-axial fluid collection. Vascular: Atherosclerotic calcifications are present within the cavernous internal carotid arteries. Skull: Normal. Negative for fracture or focal lesion. Sinuses/Orbits: No acute finding. Other: None. IMPRESSION: 1. 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 No mass effect or midline shift. No acute intracranial hemorrhage. These results were called by telephone at the time of interpretation on 12/21/2021 at 12:10 am to provider Thereasa Solo, who verbally acknowledged these results. Electronically Signed   By: Ronney Asters M.D.   On: 12/21/2021 00:12   MR ANGIO HEAD WO CONTRAST  Addendum Date: 12/21/2021   ADDENDUM REPORT: 12/21/2021 06:30 ADDENDUM: Study discussed by telephone with Dr. Inda Merlin on 12/21/2021 at 0625 hours. Electronically Signed   By: Genevie Ann M.D.   On: 12/21/2021 06:30   Result Date: 12/21/2021 CLINICAL DATA:  76 year old male left side weakness, slurred speech and facial droop. EXAM: MRI HEAD WITHOUT CONTRAST MRA HEAD WITHOUT CONTRAST TECHNIQUE: Multiplanar, multi-echo pulse sequences of the brain and surrounding structures were acquired without intravenous contrast. Angiographic images of the Circle of Willis were acquired using MRA technique without intravenous contrast. COMPARISON:  Head CT 0005 hours today. FINDINGS: MRI HEAD FINDINGS Brain: Widely scattered small areas of cortical, subcortical, and occasionally central white matter restricted diffusion in the right hemisphere primarily corresponding to the right MCA territory. Right middle and superior gyri posteriorly most affected, with associated T2 and FLAIR hyperintense cytotoxic edema. But superimposed area of chronic encephalomalacia in the superior right parietal lobe which seems to correspond to the dominant CT finding earlier today (series 6, image 45). Possible petechial hemorrhage  (series 15, image 44) there. No definite acute or malignant hemorrhagic transformation. No significant intracranial mass effect. No contralateral left hemisphere or posterior fossa restricted diffusion. No other chronic cerebral blood products identified. Mild ventricular prominence appears to be ex vacuo related. And there is also mild encephalomalacia along the central cerebellar vermis (series 11, image 8). No midline shift, mass effect, evidence of mass lesion. Cervicomedullary junction and pituitary are within normal limits. Vascular: Absent right ICA flow void in the neck and through most of the right ICA siphon (series 10, image 9). Evidence of some reconstituted flow in the right anterior circulation branches. Other Major intracranial vascular flow voids are preserved. Skull and upper cervical spine: Cervical spine disc and endplate degeneration. Visualized bone marrow signal is within normal limits. Sinuses/Orbits: Postoperative changes to both globes. Scattered mild to moderate paranasal sinus mucosal thickening. Other: Trace bilateral mastoid air cell fluid. Negative visible nasopharynx. MRA HEAD FINDINGS Study is intermittently degraded by motion artifact despite repeated imaging attempts. Anterior circulation: Absent antegrade flow right ICA siphon. Preserved anterior communicating artery flow which appears to supply the right ACA. Poor flow signal at the right MCA origin and and right MCA branches. Antegrade flow in the left ICA siphon with mild siphon irregularity and stenosis. Patent left ICA terminus. Patent left MCA and ACA origins. M but there is a moderate stenosis of the distal ild stenosis left ACA A1 segment. Left MCA M1 segment and left MCA bifurcation are patent  without stenosis. Visible left MCA branches are within normal limits. Posterior circulation: Antegrade flow in the posterior circulation with mildly dominant left vertebral artery. Patent right PICA and left AICA origins. No distal  vertebral or proximal basilar artery stenosis. But there is mild to moderate distal basilar irregularity. Only mild distal basilar stenosis. SCA and PCA origins are patent with mild irregularity. Bilateral PCA branches are patent with mild to moderate irregularity greater on the left. Anatomic variants: Mildly dominant left vertebral artery. IMPRESSION: 1. Positive for Right ICA Occlusion in the neck and at the skull base. Associated widely scattered small mostly cortical and subcortical Right MCA territory infarcts with no associated hemorrhage or mass effect. Underlying chronic infarct in the posterior right MCA territory (superior parietal lobe) with hemosiderin. 2. Right ACA collateralized by the anterior communicating artery. But there is a moderate stenosis of the Left ACA A1 segment. 3. Other intracranial atherosclerosis on MRA with mild stenoses of the Basilar Artery, bilateral PCA branches. Electronically Signed: By: Genevie Ann M.D. On: 12/21/2021 06:09   MR BRAIN WO CONTRAST  Addendum Date: 12/21/2021   ADDENDUM REPORT: 12/21/2021 06:30 ADDENDUM: Study discussed by telephone with Dr. Inda Merlin on 12/21/2021 at 0625 hours. Electronically Signed   By: Genevie Ann M.D.   On: 12/21/2021 06:30   Result Date: 12/21/2021 CLINICAL DATA:  76 year old male left side weakness, slurred speech and facial droop. EXAM: MRI HEAD WITHOUT CONTRAST MRA HEAD WITHOUT CONTRAST TECHNIQUE: Multiplanar, multi-echo pulse sequences of the brain and surrounding structures were acquired without intravenous contrast. Angiographic images of the Circle of Willis were acquired using MRA technique without intravenous contrast. COMPARISON:  Head CT 0005 hours today. FINDINGS: MRI HEAD FINDINGS Brain: Widely scattered small areas of cortical, subcortical, and occasionally central white matter restricted diffusion in the right hemisphere primarily corresponding to the right MCA territory. Right middle and superior gyri posteriorly most  affected, with associated T2 and FLAIR hyperintense cytotoxic edema. But superimposed area of chronic encephalomalacia in the superior right parietal lobe which seems to correspond to the dominant CT finding earlier today (series 6, image 45). Possible petechial hemorrhage (series 15, image 44) there. No definite acute or malignant hemorrhagic transformation. No significant intracranial mass effect. No contralateral left hemisphere or posterior fossa restricted diffusion. No other chronic cerebral blood products identified. Mild ventricular prominence appears to be ex vacuo related. And there is also mild encephalomalacia along the central cerebellar vermis (series 11, image 8). No midline shift, mass effect, evidence of mass lesion. Cervicomedullary junction and pituitary are within normal limits. Vascular: Absent right ICA flow void in the neck and through most of the right ICA siphon (series 10, image 9). Evidence of some reconstituted flow in the right anterior circulation branches. Other Major intracranial vascular flow voids are preserved. Skull and upper cervical spine: Cervical spine disc and endplate degeneration. Visualized bone marrow signal is within normal limits. Sinuses/Orbits: Postoperative changes to both globes. Scattered mild to moderate paranasal sinus mucosal thickening. Other: Trace bilateral mastoid air cell fluid. Negative visible nasopharynx. MRA HEAD FINDINGS Study is intermittently degraded by motion artifact despite repeated imaging attempts. Anterior circulation: Absent antegrade flow right ICA siphon. Preserved anterior communicating artery flow which appears to supply the right ACA. Poor flow signal at the right MCA origin and and right MCA branches. Antegrade flow in the left ICA siphon with mild siphon irregularity and stenosis. Patent left ICA terminus. Patent left MCA and ACA origins. M but there is a moderate stenosis of  the distal ild stenosis left ACA A1 segment. Left MCA M1  segment and left MCA bifurcation are patent without stenosis. Visible left MCA branches are within normal limits. Posterior circulation: Antegrade flow in the posterior circulation with mildly dominant left vertebral artery. Patent right PICA and left AICA origins. No distal vertebral or proximal basilar artery stenosis. But there is mild to moderate distal basilar irregularity. Only mild distal basilar stenosis. SCA and PCA origins are patent with mild irregularity. Bilateral PCA branches are patent with mild to moderate irregularity greater on the left. Anatomic variants: Mildly dominant left vertebral artery. IMPRESSION: 1. Positive for Right ICA Occlusion in the neck and at the skull base. Associated widely scattered small mostly cortical and subcortical Right MCA territory infarcts with no associated hemorrhage or mass effect. Underlying chronic infarct in the posterior right MCA territory (superior parietal lobe) with hemosiderin. 2. Right ACA collateralized by the anterior communicating artery. But there is a moderate stenosis of the Left ACA A1 segment. 3. Other intracranial atherosclerosis on MRA with mild stenoses of the Basilar Artery, bilateral PCA branches. Electronically Signed: By: Genevie Ann M.D. On: 12/21/2021 06:09   ECHOCARDIOGRAM COMPLETE BUBBLE STUDY  Result Date: 12/21/2021    ECHOCARDIOGRAM REPORT   Patient Name:   Luis Brewer Date of Exam: 12/21/2021 Medical Rec #:  [id number]     Height:       69.0 in Accession #:    [id number]    Weight:       180.0 lb Date of Birth:  Mar 21, 1946     BSA:          1.976 m Patient Age:    13 years      BP:           157/71 mmHg Patient Gender: M             HR:           52 bpm. Exam Location:  Inpatient Procedure: 2D Echo Indications:    stroke  History:        Patient has no prior history of Echocardiogram examinations.                 Risk Factors:Diabetes, Hypertension and Dyslipidemia.  Sonographer:    Johny Chess RDCS Referring Phys: [id number] Sherryll Burger [facility]  Sonographer Comments: Image acquisition challenging due to respiratory motion. IMPRESSIONS  1. Left ventricular ejection fraction, by estimation, is 50 to 55%. The left ventricle has low normal function. The left ventricle has no regional wall motion abnormalities. Left ventricular diastolic parameters were normal.  2. Right ventricular systolic function is normal. The right ventricular size is normal. There is normal pulmonary artery systolic pressure.  3. The mitral valve is normal in structure. Trivial mitral valve regurgitation. No evidence of mitral stenosis.  4. The aortic valve is tricuspid. Aortic valve regurgitation is not visualized. No aortic stenosis is present.  5. The inferior vena cava is normal in size with greater than 50% respiratory variability, suggesting right atrial pressure of 3 mmHg. Comparison(s): No prior Echocardiogram. FINDINGS  Left Ventricle: Left ventricular ejection fraction, by estimation, is 50 to 55%. The left ventricle has low normal function. The left ventricle has no regional wall motion abnormalities. The left ventricular internal cavity size was normal in size. There is no left ventricular hypertrophy. Left ventricular diastolic parameters were normal. Right Ventricle: The right ventricular size is normal. No increase in right ventricular wall thickness. Right ventricular systolic function is  normal. There is normal pulmonary artery systolic pressure. The tricuspid regurgitant velocity is 2.10 m/s, and  with an assumed right atrial pressure of 3 mmHg, the estimated right ventricular systolic pressure is 16.9 mmHg. Left Atrium: Left atrial size was normal in size. Right Atrium: Right atrial size was normal in size. Pericardium: There is no evidence of pericardial effusion. Mitral Valve: The mitral valve is normal in structure. Trivial mitral valve regurgitation. No evidence of mitral valve stenosis. Tricuspid Valve: The tricuspid valve is normal in structure.  Tricuspid valve regurgitation is mild . No evidence of tricuspid stenosis. Aortic Valve: The aortic valve is tricuspid. Aortic valve regurgitation is not visualized. No aortic stenosis is present. Pulmonic Valve: The pulmonic valve was normal in structure. Pulmonic valve regurgitation is not visualized. No evidence of pulmonic stenosis. Aorta: The aortic root is normal in size and structure. Venous: The inferior vena cava is normal in size with greater than 50% respiratory variability, suggesting right atrial pressure of 3 mmHg. IAS/Shunts: No atrial level shunt detected by color flow Doppler.  LEFT VENTRICLE PLAX 2D LVIDd:         4.60 cm   Diastology LVIDs:         3.00 cm   LV e' medial:    8.16 cm/s LV PW:         0.90 cm   LV E/e' medial:  10.1 LV IVS:        0.90 cm   LV e' lateral:   9.57 cm/s LVOT diam:     2.30 cm   LV E/e' lateral: 8.6 LV SV:         76 LV SV Index:   39 LVOT Area:     4.15 cm  RIGHT VENTRICLE RV S prime:     8.59 cm/s TAPSE (M-mode): 1.3 cm LEFT ATRIUM           Index        RIGHT ATRIUM           Index LA diam:      3.10 cm 1.57 cm/m   RA Area:     11.20 cm LA Vol (A4C): 43.5 ml 22.02 ml/m  RA Volume:   25.90 ml  13.11 ml/m  AORTIC VALVE LVOT Vmax:   89.30 cm/s LVOT Vmean:  56.800 cm/s LVOT VTI:    0.184 m  AORTA Ao Root diam: 3.20 cm Ao Asc diam:  3.20 cm MITRAL VALVE               TRICUSPID VALVE MV Area (PHT): 3.06 cm    TR Peak grad:   17.6 mmHg MV Decel Time: 248 msec    TR Vmax:        210.00 cm/s MV E velocity: 82.50 cm/s MV A velocity: 57.50 cm/s  SHUNTS MV E/A ratio:  1.43        Systemic VTI:  0.18 m                            Systemic Diam: 2.30 cm Kardie Tobb DO Electronically signed by Berniece Salines DO Signature Date/Time: 12/21/2021/4:21:14 PM    Final     Microbiology: Results for orders placed or performed during the hospital encounter of 12/20/21  Resp Panel by RT-PCR (Flu A&B, Covid) Nasopharyngeal Swab     Status: None   Collection Time: 12/20/21 11:03 PM    Specimen: Nasopharyngeal Swab; Nasopharyngeal(NP) swabs in vial transport  medium  Result Value Ref Range Status   SARS Coronavirus 2 by RT PCR NEGATIVE NEGATIVE Final    Comment: (NOTE) SARS-CoV-2 target nucleic acids are NOT DETECTED.  The SARS-CoV-2 RNA is generally detectable in upper respiratory specimens during the acute phase of infection. The lowest concentration of SARS-CoV-2 viral copies this assay can detect is 138 copies/mL. A negative result does not preclude SARS-Cov-2 infection and should not be used as the sole basis for treatment or other patient management decisions. A negative result may occur with  improper specimen collection/handling, submission of specimen other than nasopharyngeal swab, presence of viral mutation(s) within the areas targeted by this assay, and inadequate number of viral copies(<138 copies/mL). A negative result must be combined with clinical observations, patient history, and epidemiological information. The expected result is Negative.  Fact Sheet for Patients:  EntrepreneurPulse.com.au  Fact Sheet for Healthcare Providers:  IncredibleEmployment.be  This test is no t yet approved or cleared by the [place] FDA and  has been authorized for detection and/or diagnosis of SARS-CoV-2 by FDA under an Emergency Use Authorization (EUA). This EUA will remain  in effect (meaning this test can be used) for the duration of the COVID-19 declaration under Section 564(b)(1) of the Act, 21 U.S.C.section 360bbb-3(b)(1), unless the authorization is terminated  or revoked sooner.       Influenza A by PCR NEGATIVE NEGATIVE Final   Influenza B by PCR NEGATIVE NEGATIVE Final    Comment: (NOTE) The Xpert Xpress SARS-CoV-2/FLU/RSV plus assay is intended as an aid in the diagnosis of influenza from Nasopharyngeal swab specimens and should not be used as a sole basis for treatment. Nasal washings and aspirates are  unacceptable for Xpert Xpress SARS-CoV-2/FLU/RSV testing.  Fact Sheet for Patients: EntrepreneurPulse.com.au  Fact Sheet for Healthcare Providers: IncredibleEmployment.be  This test is not yet approved or cleared by the [place] FDA and has been authorized for detection and/or diagnosis of SARS-CoV-2 by FDA under an Emergency Use Authorization (EUA). This EUA will remain in effect (meaning this test can be used) for the duration of the COVID-19 declaration under Section 564(b)(1) of the Act, 21 U.S.C. section 360bbb-3(b)(1), unless the authorization is terminated or revoked.  Performed at [facility], [place] [address], [place], [place] [address]     Labs: CBC: Recent Labs  Lab 12/20/21 2313 12/20/21 2324 12/21/21 0331  WBC 7.0  --  6.1  NEUTROABS 3.3  --  2.9  HGB 14.5 15.6 13.7  HCT 43.6 46.0 39.6  MCV 96.5  --  97.1  PLT 330  --  619   Basic Metabolic Panel: Recent Labs  Lab 12/20/21 2313 12/20/21 2324 12/21/21 0331 12/22/21 0224  NA 131* 135 133* 135  K 4.6 4.3 5.4* 3.8  CL 100 101 105 102  CO2 21*  --  18* 23  GLUCOSE 109* 100* 94 66*  BUN 16 18 15 18   CREATININE 1.17 1.30* 1.07 1.14  CALCIUM 9.2  --  8.7* 9.4  MG  --   --  1.9  --    Liver Function Tests: Recent Labs  Lab 12/20/21 2313 12/21/21 0331 12/22/21 0224  AST 29 37 31  ALT 23 25 25   ALKPHOS 42 36* 43  BILITOT 0.3 1.4* 0.7  PROT 7.7 6.5 7.2  ALBUMIN 3.8 3.4* 3.6   CBG: Recent Labs  Lab 12/21/21 1134 12/21/21 1633 12/21/21 2120 12/22/21 0634 12/22/21 1419  GLUCAP 73 90 155* 99 156*    Discharge time  spent: greater than 30 minutes.  Signed: Alma Friendly, MD [facility] 12/22/2021

## 2021-12-22 NOTE — BH Assessment (Signed)
TTS order placed for patient to be seen by psychiatry. Confirmed with  Dr. Jarvis Newcomer that order was mistakenly placed and will be cancelled. No TTS assessment to be completed for this patient.

## 2021-12-22 NOTE — Evaluation (Signed)
Physical Therapy Evaluation Patient Details Name: Luis Brewer MRN: [id number] DOB: [birth year] Today's Date: 12/22/2021  History of Present Illness  76 y.o. male who presented to ED 1/31 with LUE weakness and slurred speech x2 days. MRI (+) scattered right MCA infarcts with chronic posterior right MCA infarct. PMHx: cancer, depression, DM type II, HTN.  Clinical Impression   Pt presents with L weakness UE>LE, impaired balance scoring 14/24 on DGI demonstrating high fall risk, impaired activity tolerance vs baseline, impaired gait, and impaired command following vs anticipated baseline. Pt to benefit from acute PT to address deficits. Pt ambulated good hallway distance with use of SPC, requires close guard and demonstrates impaired dynamic balance when challenged. PT recommending increased supervision for mobility in home from wife and male friend who can assist during the day, as well as OP neuro. PT to progress mobility as tolerated, and will continue to follow acutely.         Recommendations for follow up therapy are one component of a multi-disciplinary discharge planning process, led by the attending physician.  Recommendations may be updated based on patient status, additional functional criteria and insurance authorization.  Follow Up Recommendations Outpatient PT    Assistance Recommended at Discharge Intermittent Supervision/Assistance  Patient can return home with the following  A little help with walking and/or transfers;A little help with bathing/dressing/bathroom;Direct supervision/assist for medications management;Direct supervision/assist for financial management;Assistance with cooking/housework    Equipment Recommendations None recommended by PT  Recommendations for Other Services       Functional Status Assessment Patient has had a recent decline in their functional status and demonstrates the ability to make significant improvements in function in a reasonable and  predictable amount of time.     Precautions / Restrictions Precautions Precautions: Fall Precaution Comments: permissive HTN Restrictions Weight Bearing Restrictions: No      Mobility  Bed Mobility Overal bed mobility: Needs Assistance Bed Mobility: Supine to Sit     Supine to sit: Supervision, HOB elevated     General bed mobility comments: General supervision for safety    Transfers Overall transfer level: Needs assistance Equipment used: Straight cane Transfers: Sit to/from Stand Sit to Stand: Min guard           General transfer comment: Close guard for saftey and self steady    Ambulation/Gait Ambulation/Gait assistance: Supervision, Min guard   Assistive device: Straight cane Gait Pattern/deviations: Step-through pattern, Decreased step length - left, Decreased stance time - left, Trunk flexed Gait velocity: WNL     General Gait Details: Supervision for saftey for straight line ambulation, closer guard with challenged dynamic balance (see balance section), and turns  Financial trader Rankin (Stroke Patients Only) Modified Rankin (Stroke Patients Only) Pre-Morbid Rankin Score: Slight disability Modified Rankin: Moderately severe disability     Balance Overall balance assessment: Needs assistance Sitting-balance support: Feet supported Sitting balance-Leahy Scale: Good     Standing balance support: No upper extremity supported, During functional activity Standing balance-Leahy Scale: Fair                   Standardized Balance Assessment Standardized Balance Assessment : Dynamic Gait Index   Dynamic Gait Index Level Surface: Mild Impairment Change in Gait Speed: Mild Impairment Gait with Horizontal Head Turns: Mild Impairment Gait with Vertical Head Turns: Mild Impairment Gait and Pivot Turn: Mild Impairment Step Over Obstacle: Mild Impairment Step Around Obstacles:  Mild Impairment Steps:  Moderate Impairment Total Score: 15       Pertinent Vitals/Pain Pain Assessment Pain Assessment: No/denies pain    Home Living Family/patient expects to be discharged to:: Private residence Living Arrangements: Alone Available Help at Discharge: Family;Friend(s);Available PRN/intermittently Type of Home: Apartment Home Access: Level entry       Home Layout: One level Home Equipment: Cane - single point Additional Comments: daughter near by to assist as needed, pt has a friend who also assists as needed    Prior Function Prior Level of Function : Needs assist             Mobility Comments: SPC for all mobility ADLs Comments: indep, does not drive, daughter assists with finance mgmt & "I don't take no meducation."     Hand Dominance   Dominant Hand: Right    Extremity/Trunk Assessment   Upper Extremity Assessment Upper Extremity Assessment: Defer to OT evaluation    Lower Extremity Assessment Lower Extremity Assessment: RLE deficits/detail;LLE deficits/detail RLE Deficits / Details: 4/5 hip flexion, 4/5 hip abd/add, 5/5 knee extension, 4/5 knee flexion, 4/5 DF/PF LLE Deficits / Details: 3+/5 hip flexion, 3+/5 hip abd/add, 4/5 knee extension, 3+/5 knee flexion, 4/5 DF/PF    Cervical / Trunk Assessment Cervical / Trunk Assessment: Normal  Communication   Communication: No difficulties  Cognition Arousal/Alertness: Awake/alert Behavior During Therapy: WFL for tasks assessed/performed Overall Cognitive Status: Impaired/Different from baseline Area of Impairment: Attention, Following commands, Safety/judgement, Problem solving, Awareness                   Current Attention Level: Selective   Following Commands: Follows multi-step commands inconsistently, Follows one step commands consistently Safety/Judgement: Decreased awareness of safety Awareness: Intellectual Problem Solving: Difficulty sequencing, Requires verbal cues, Requires tactile  cues General Comments: At times requires multimodal and repeated cuing for following commands, states he is in the hospital because "I smoke and have high blood pressure" but PT oriented pt to CVA        General Comments      Exercises     Assessment/Plan    PT Assessment Patient needs continued PT services  PT Problem List Decreased strength;Decreased mobility;Decreased balance;Decreased knowledge of use of DME;Pain;Decreased activity tolerance;Decreased safety awareness       PT Treatment Interventions DME instruction;Therapeutic activities;Gait training;Therapeutic exercise;Patient/family education;Balance training;Functional mobility training;Neuromuscular re-education    PT Goals (Current goals can be found in the Care Plan section)  Acute Rehab PT Goals PT Goal Formulation: With patient Time For Goal Achievement: 01/05/22 Potential to Achieve Goals: Good    Frequency Min 4X/week     Co-evaluation               AM-PAC PT "6 Clicks" Mobility  Outcome Measure Help needed turning from your back to your side while in a flat bed without using bedrails?: A Little Help needed moving from lying on your back to sitting on the side of a flat bed without using bedrails?: A Little Help needed moving to and from a bed to a chair (including a wheelchair)?: A Little Help needed standing up from a chair using your arms (e.g., wheelchair or bedside chair)?: A Little Help needed to walk in hospital room?: A Little Help needed climbing 3-5 steps with a railing? : A Little 6 Click Score: 18    End of Session Equipment Utilized During Treatment: Gait belt Activity Tolerance: Patient tolerated treatment well Patient left: in chair;with call bell/phone within reach;with chair  alarm set Nurse Communication: Mobility status PT Visit Diagnosis: Other abnormalities of gait and mobility (R26.89);Hemiplegia and hemiparesis Hemiplegia - Right/Left: Left Hemiplegia -  dominant/non-dominant: Non-dominant Hemiplegia - caused by: Cerebral infarction    Time: 1025-8527 PT Time Calculation (min) (ACUTE ONLY): 29 min   Charges:   PT Evaluation $PT Eval Low Complexity: 1 Low PT Treatments $Gait Training: 8-22 mins        Stacie Glaze, PT DPT Acute Rehabilitation Services Pager [phone]  Office [phone]   Roxine Caddy E Ruffin Pyo 12/22/2021, 9:46 AM
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
## 2021-12-22 NOTE — Progress Notes (Addendum)
STROKE TEAM PROGRESS NOTE   INTERVAL HISTORY Patient is seen in his room with no family at the bedside.  He has been hemodynamically stable overnight, and his neurological exam is unchanged.  He has been discharged home today with plans to have elective carotid surgery next week Vitals:   12/21/21 2330 12/22/21 0347 12/22/21 0744 12/22/21 1123  BP: 127/64 (!) 150/62 119/65 127/80  Pulse: (!) 56 (!) 51 (!) 59 63  Resp: 19 18 16 16   Temp:  97.6 F (36.4 C) 98.6 F (37 C) 98.6 F (37 C)  TempSrc:  Oral Oral Oral  SpO2: 100%  100% 100%   CBC:  Recent Labs  Lab 12/20/21 2313 12/20/21 2324 12/21/21 0331  WBC 7.0  --  6.1  NEUTROABS 3.3  --  2.9  HGB 14.5 15.6 13.7  HCT 43.6 46.0 39.6  MCV 96.5  --  97.1  PLT 330  --  601    Basic Metabolic Panel:  Recent Labs  Lab 12/21/21 0331 12/22/21 0224  NA 133* 135  K 5.4* 3.8  CL 105 102  CO2 18* 23  GLUCOSE 94 66*  BUN 15 18  CREATININE 1.07 1.14  CALCIUM 8.7* 9.4  MG 1.9  --     Lipid Panel:  Recent Labs  Lab 12/21/21 0331  CHOL 143  TRIG 63  HDL 38*  CHOLHDL 3.8  VLDL 13  LDLCALC 92    HgbA1c:  Recent Labs  Lab 12/21/21 0332  HGBA1C 6.7*    Urine Drug Screen:  Recent Labs  Lab 12/20/21 2303  LABOPIA NONE DETECTED  COCAINSCRNUR NONE DETECTED  LABBENZ NONE DETECTED  AMPHETMU NONE DETECTED  THCU NONE DETECTED  LABBARB NONE DETECTED     Alcohol Level  Recent Labs  Lab 12/20/21 2313  ETH 124*     IMAGING past 24 hours No results found.  PHYSICAL EXAM General: Alert, well-developed, well-nourished African-American male in no acute distress   NEURO:  Mental Status: AA&Ox3  Speech/Language: speech is without dysarthria or aphasia.  Repetition, fluency, and comprehension intact.  Cranial Nerves:  II: PERRL. Visual fields full.  III, IV, VI: EOMI. Eyelids elevate symmetrically.  V: Sensation is intact to light touch and symmetrical to face.  VII: Smile is symmetrical.  VIII: hearing intact  to voice. IX, X: Phonation is normal.  XII: tongue is midline without fasciculations. Motor: 5/5 strength to RUE, RLE and LLE, LUE strength 4+/5 with left grip weakness.   Orbits right over left upper extremity. Tone: is normal and bulk is normal Sensation- Intact to light touch bilaterally.  Coordination: FTN intact bilaterally, slight dysmetria on left, Gait- deferred   ASSESSMENT/PLAN Mr. Blessed Cotham is a 76 y.o. male with history of ETOH use, smoking, DM2, GERD, HTN, depression and sarcoma of the left thigh presenting with weakness of his left hand which began six days ago.  He initially thought it would improve on its own, but a friend convinced him to go to the ED.  He was found to have a small frontal lobe stroke on CT.  MRI reveals scattered right MCA infarcts.  Stroke team will sign off but will remain available for any questions or concerns.  Stroke:  right middle cerebral artery branch infarct likely secondary due to occlusion of proximal right ICA from large vessel disease CT head Small acute cortical infarct in high right frontoparietal lobe CTA head & neck Right ICA occlusion in neck distal to bulb, moderate left ICA stenosis MRI  scattered small areas of restricted diffusion in right MCA territory, chronic infarct in posterior right MCA territory MRA  Right ICA occlusion in the neck 2D Echo pending LDL 92 HgbA1c 6.7 VTE prophylaxis - lovenox    Diet   Diet heart healthy/carb modified Room service appropriate? Yes; Fluid consistency: Thin   No antithrombotic prior to admission, now on aspirin 81 mg daily and clopidogrel 75 mg daily. x3 months followed by aspirin alone Therapy recommendations:  outpatient PT/OT/SLP Disposition:  pending  Hypertension Home meds:  none Stable Permissive hypertension (OK if < 220/120) but gradually normalize in 5-7 days Long-term BP goal normotensive  Hyperlipidemia Home meds:  none LDL 92, goal < 70 Add atorvastatin 40 mg daily   Continue statin at discharge  Diabetes type II Controlled Home meds:  none HgbA1c 6.7, goal < 7.0 CBGs Recent Labs    12/21/21 1633 12/21/21 2120 12/22/21 0634  GLUCAP 90 155* 99     SSI  Other Stroke Risk Factors Advanced Age >/= 27  Cigarette smoker advised to stop smoking ETOH use, alcohol level 124, advised to drink no more than 2 drink(s) a day  Other Active Problems ETOH abuse CIWA protocol with lorazepam  Hospital day # [place] , MSN, AGACNP-BC [facility] See Amion for schedule and pager information 12/22/2021 11:37 AM I have personally obtained history,examined this patient, reviewed notes, independently viewed imaging studies, participated in medical decision making and plan of care.ROS completed by me personally and pertinent positives fully documented  I have made any additions or clarifications directly to the above note. Agree with note above.  His left hand weakness appears to be improving.  Continue dual antiplatelet therapy and elective left carotid revascularization by vascular surgery next week.  Patient is to continue aggressive risk factor modification.Discharge home.  Follow-up with an outpatient stroke clinic in 3 months.  Greater than 50% time during t this 35-minute visit was spent in counseling and coordination of care and discussion with patient and care team and answering questions.  Antony Contras, MD Medical Director [facility] Stroke [facility] Pager: [phone] 12/22/2021 1:58 PM    To contact Stroke Continuity provider, please refer to [email or web address] After hours, contact General Neurology

## 2021-12-22 NOTE — TOC Transition Note (Signed)
Transition of Care [facility]) - CM/SW Discharge Note   Patient Details  Name: Luis Brewer MRN: [id number] Date of Birth: 03/24/1946  [facility] CM/SW Contact:  Pollie Friar, RN Phone Number: 12/22/2021, 3:28 PM   Clinical Narrative:    Pt is discharging home with outpatient therapy arranged through [facility]. Information on the AVS. Pt's daughter will assist with transportation. She also will check on him daily after d/c.  PCP arranged through [facility] with information on the AVS.  Daughter to provide transport home.    Final next level of care: OP Rehab Barriers to Discharge: No Barriers Identified   Patient Goals and CMS Choice     Choice offered to / list presented to : Adult Children  Discharge Placement                       Discharge Plan and Services                                     Social Determinants of Health (SDOH) Interventions     Readmission Risk Interventions No flowsheet data found.

## 2022-01-24 ENCOUNTER — Ambulatory Visit: Payer: Medicare (Managed Care)

## 2022-01-24 ENCOUNTER — Ambulatory Visit: Payer: Medicare (Managed Care) | Admitting: Occupational Therapy

## 2022-02-02 ENCOUNTER — Institutional Professional Consult (permissible substitution): Payer: Medicare (Managed Care) | Admitting: Pulmonary Disease
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
## 2023-07-08 IMAGING — MR MR MRA HEAD W/O CM
1 series · 14 of 48 positions shown · non-contrast
Comparison: Head CT 0007 hours today.
COMPARISON: Head CT 0007 hours today.

Addendum:
CLINICAL DATA: 75-year-old male left side weakness, slurred speech
and facial droop.

EXAM:
MRI HEAD WITHOUT CONTRAST
MRA HEAD WITHOUT CONTRAST
TECHNIQUE: Multiplanar, multi-echo pulse sequences of the brain and surrounding
structures were acquired without intravenous contrast. Angiographic
images of the Circle of Willis were acquired using MRA technique
without intravenous contrast.

[Series 5: 3d cow · axial · 0.5mm · 0.42mm/px · z∈[-71,+6]mm · 14 of 172 slices shown]
[im 1/172]
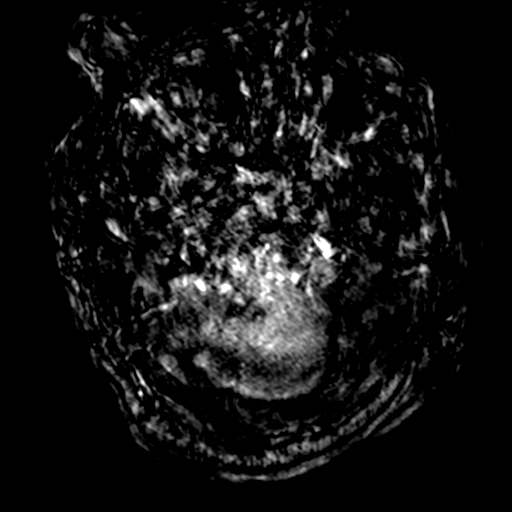
[im 4/172]
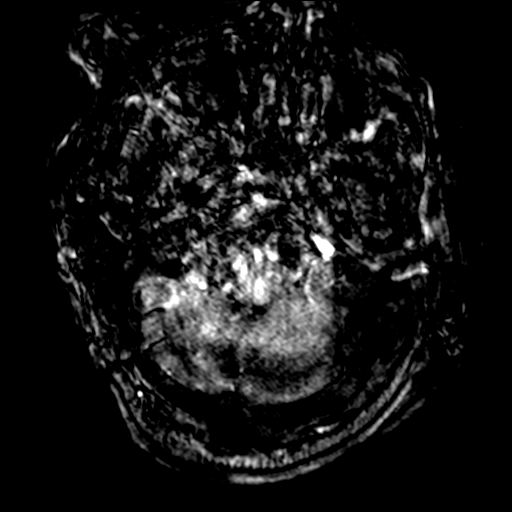
[im 8/172]
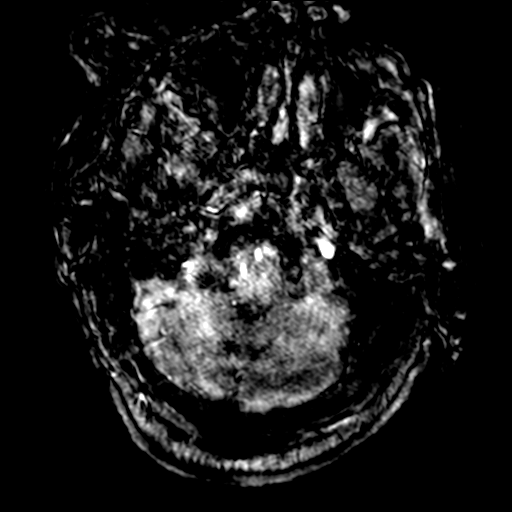
[im 11/172]
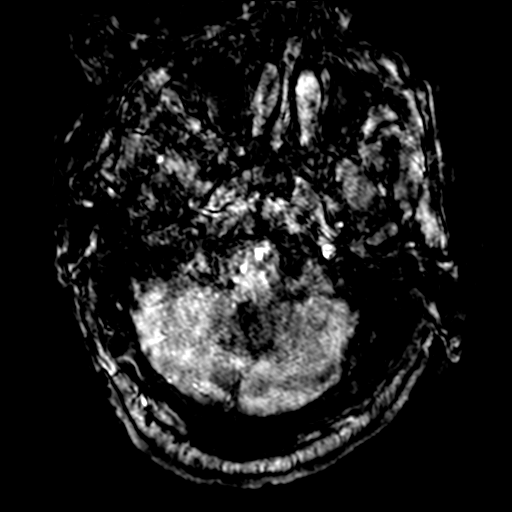
[im 30/172]
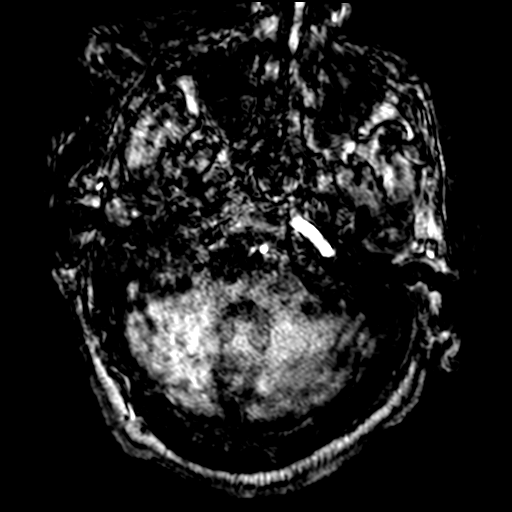
[im 33/172]
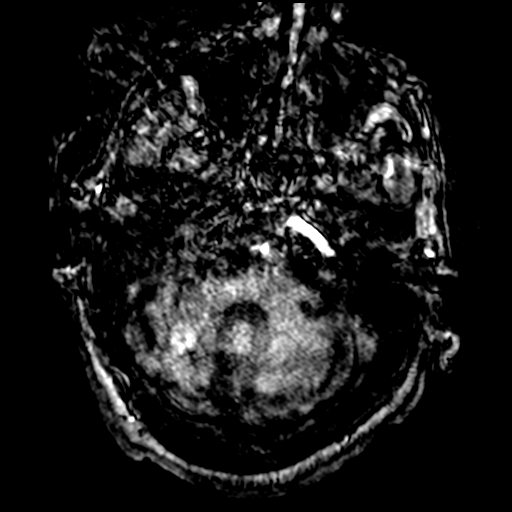
[im 55/172]
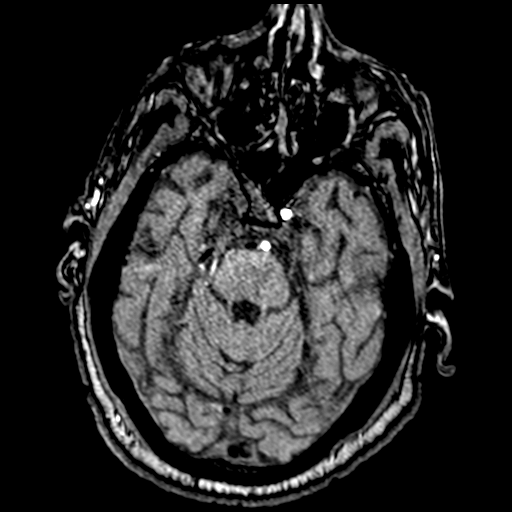
[im 77/172]
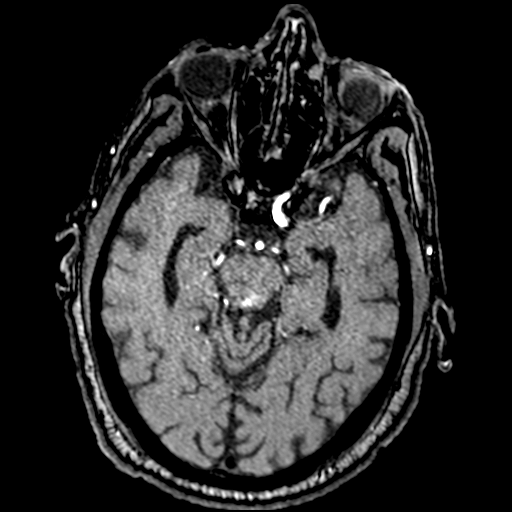
[im 88/172]
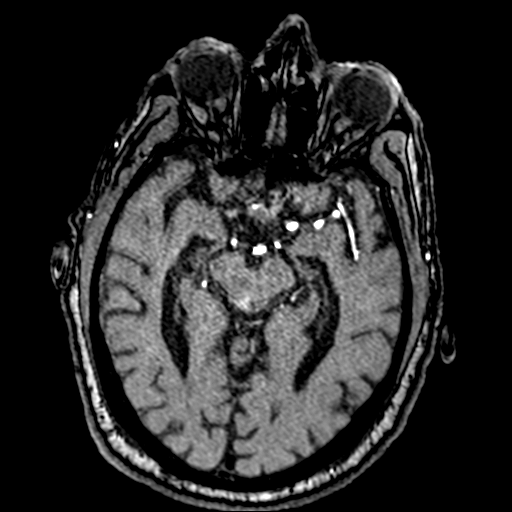
[im 99/172]
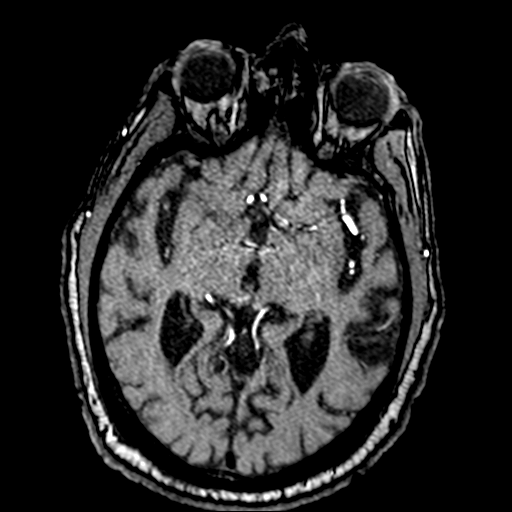
[im 121/172]
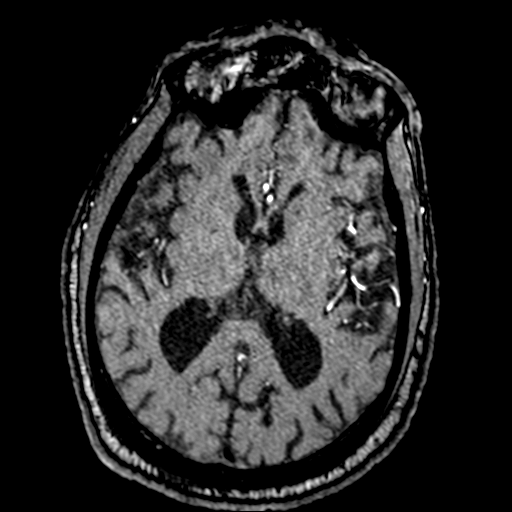
[im 142/172]
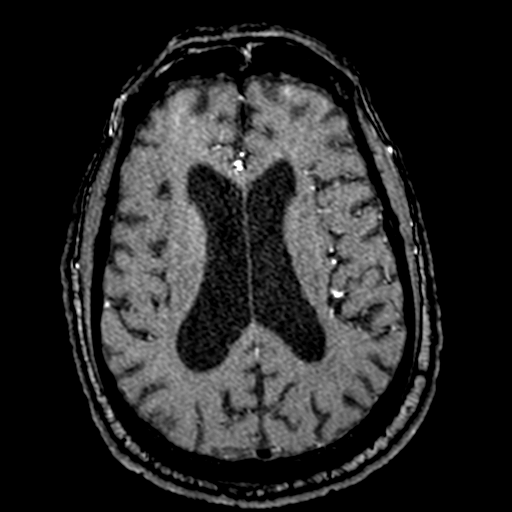
[im 146/172]
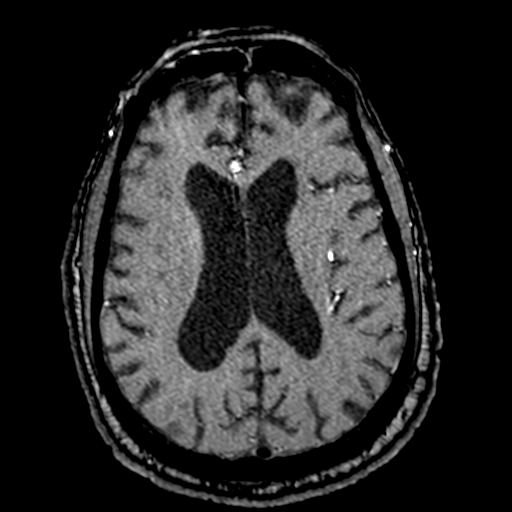
[im 164/172]
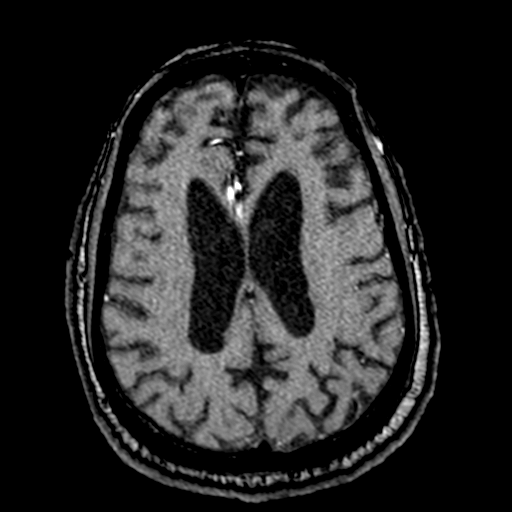

[14 of 48 positions shown; findings below may reference images not displayed]

FINDINGS: MRI HEAD FINDINGS

Brain: Widely scattered small areas of cortical, subcortical, and
occasionally central white matter restricted diffusion in the right
hemisphere primarily corresponding to the right MCA territory.

Right middle and superior gyri posteriorly most affected, with
associated T2 and FLAIR hyperintense cytotoxic edema.

But superimposed area of chronic encephalomalacia in the superior
right parietal lobe which seems to correspond to the dominant CT
finding earlier today (series 6, image 45). Possible petechial
hemorrhage (series 15, image 44) there. No definite acute or
malignant hemorrhagic transformation.

No significant intracranial mass effect. No contralateral left
hemisphere or posterior fossa restricted diffusion. No other chronic
cerebral blood products identified. Mild ventricular prominence
appears to be ex vacuo related. And there is also mild
encephalomalacia along the central cerebellar vermis (series 11,
image 8). No midline shift, mass effect, evidence of mass lesion.
Cervicomedullary junction and pituitary are within normal limits.

Vascular: Absent right ICA flow void in the neck and through most of
the right ICA siphon (series 10, image 9). Evidence of some
reconstituted flow in the right anterior circulation branches. Other
Major intracranial vascular flow voids are preserved.

Skull and upper cervical spine: Cervical spine disc and endplate
degeneration. Visualized bone marrow signal is within normal limits.

Sinuses/Orbits: Postoperative changes to both globes. Scattered mild
to moderate paranasal sinus mucosal thickening.

Other: Trace bilateral mastoid air cell fluid. Negative visible
nasopharynx.

MRA HEAD FINDINGS

Study is intermittently degraded by motion artifact despite repeated
imaging attempts.

Anterior circulation: Absent antegrade flow right ICA siphon.
Preserved anterior communicating artery flow which appears to supply
the right ACA. Poor flow signal at the right MCA origin and and
right MCA branches.

Antegrade flow in the left ICA siphon with mild siphon irregularity
and stenosis. Patent left ICA terminus. Patent left MCA and ACA
origins. M but there is a moderate stenosis of the distal ild
stenosis left ACA A1 segment. Left MCA M1 segment and left MCA
bifurcation are patent without stenosis. Visible left MCA branches
are within normal limits.

Posterior circulation: Antegrade flow in the posterior circulation
with mildly dominant left vertebral artery. Patent right PICA and
left AICA origins. No distal vertebral or proximal basilar artery
stenosis. But there is mild to moderate distal basilar irregularity.
Only mild distal basilar stenosis. SCA and PCA origins are patent
with mild irregularity. Bilateral PCA branches are patent with mild
to moderate irregularity greater on the left.

Anatomic variants: Mildly dominant left vertebral artery.
IMPRESSION: 1. Positive for Right ICA Occlusion in the neck and at the skull
base.
Associated widely scattered small mostly cortical and subcortical
Right MCA territory infarcts with no associated hemorrhage or mass
effect.
Underlying chronic infarct in the posterior right MCA territory
(superior parietal lobe) with hemosiderin.

2. Right ACA collateralized by the anterior communicating artery.
But there is a moderate stenosis of the Left ACA A1 segment.

3. Other intracranial atherosclerosis on MRA with mild stenoses of
the Basilar Artery, bilateral PCA branches.

ADDENDUM:
Study discussed by telephone with Dr. SAVIO LOCKLEAR on 12/21/2021 at
5932 hours.

*** End of Addendum ***
FINDINGS: MRI HEAD FINDINGS

Brain: Widely scattered small areas of cortical, subcortical, and
occasionally central white matter restricted diffusion in the right
hemisphere primarily corresponding to the right MCA territory.

Right middle and superior gyri posteriorly most affected, with
associated T2 and FLAIR hyperintense cytotoxic edema.

But superimposed area of chronic encephalomalacia in the superior
right parietal lobe which seems to correspond to the dominant CT
finding earlier today (series 6, image 45). Possible petechial
hemorrhage (series 15, image 44) there. No definite acute or
malignant hemorrhagic transformation.

No significant intracranial mass effect. No contralateral left
hemisphere or posterior fossa restricted diffusion. No other chronic
cerebral blood products identified. Mild ventricular prominence
appears to be ex vacuo related. And there is also mild
encephalomalacia along the central cerebellar vermis (series 11,
image 8). No midline shift, mass effect, evidence of mass lesion.
Cervicomedullary junction and pituitary are within normal limits.

Vascular: Absent right ICA flow void in the neck and through most of
the right ICA siphon (series 10, image 9). Evidence of some
reconstituted flow in the right anterior circulation branches. Other
Major intracranial vascular flow voids are preserved.

Skull and upper cervical spine: Cervical spine disc and endplate
degeneration. Visualized bone marrow signal is within normal limits.

Sinuses/Orbits: Postoperative changes to both globes. Scattered mild
to moderate paranasal sinus mucosal thickening.

Other: Trace bilateral mastoid air cell fluid. Negative visible
nasopharynx.

MRA HEAD FINDINGS

Study is intermittently degraded by motion artifact despite repeated
imaging attempts.

Anterior circulation: Absent antegrade flow right ICA siphon.
Preserved anterior communicating artery flow which appears to supply
the right ACA. Poor flow signal at the right MCA origin and and
right MCA branches.

Antegrade flow in the left ICA siphon with mild siphon irregularity
and stenosis. Patent left ICA terminus. Patent left MCA and ACA
origins. M but there is a moderate stenosis of the distal ild
stenosis left ACA A1 segment. Left MCA M1 segment and left MCA
bifurcation are patent without stenosis. Visible left MCA branches
are within normal limits.

Posterior circulation: Antegrade flow in the posterior circulation
with mildly dominant left vertebral artery. Patent right PICA and
left AICA origins. No distal vertebral or proximal basilar artery
stenosis. But there is mild to moderate distal basilar irregularity.
Only mild distal basilar stenosis. SCA and PCA origins are patent
with mild irregularity. Bilateral PCA branches are patent with mild
to moderate irregularity greater on the left.

Anatomic variants: Mildly dominant left vertebral artery.
IMPRESSION: 1. Positive for Right ICA Occlusion in the neck and at the skull
base.
Associated widely scattered small mostly cortical and subcortical
Right MCA territory infarcts with no associated hemorrhage or mass
effect.
Underlying chronic infarct in the posterior right MCA territory
(superior parietal lobe) with hemosiderin.

2. Right ACA collateralized by the anterior communicating artery.
But there is a moderate stenosis of the Left ACA A1 segment.

3. Other intracranial atherosclerosis on MRA with mild stenoses of
the Basilar Artery, bilateral PCA branches.
